# Patient Record
Sex: Female | Born: 1950
Health system: Southern US, Community
[De-identification: ages and names within clinical notes are randomized; demographics above are authoritative.]

## PROBLEM LIST (undated history)

## (undated) DIAGNOSIS — F32A Depression, unspecified: Secondary | ICD-10-CM

## (undated) DIAGNOSIS — F419 Anxiety disorder, unspecified: Secondary | ICD-10-CM

## (undated) DIAGNOSIS — E079 Disorder of thyroid, unspecified: Secondary | ICD-10-CM

## (undated) DIAGNOSIS — I10 Essential (primary) hypertension: Secondary | ICD-10-CM

## (undated) DIAGNOSIS — C801 Malignant (primary) neoplasm, unspecified: Secondary | ICD-10-CM

## (undated) DIAGNOSIS — F329 Major depressive disorder, single episode, unspecified: Secondary | ICD-10-CM

## (undated) DIAGNOSIS — E7800 Pure hypercholesterolemia, unspecified: Secondary | ICD-10-CM

## (undated) HISTORY — DX: Disorder of thyroid, unspecified: E07.9

## (undated) HISTORY — DX: Anxiety disorder, unspecified: F41.9

## (undated) HISTORY — DX: Depression, unspecified: F32.A

## (undated) HISTORY — DX: Major depressive disorder, single episode, unspecified: F32.9

## (undated) HISTORY — DX: Essential (primary) hypertension: I10

## (undated) HISTORY — DX: Pure hypercholesterolemia, unspecified: E78.00

## (undated) HISTORY — PX: TONSILLECTOMY: SUR1361

## (undated) HISTORY — DX: Malignant (primary) neoplasm, unspecified: C80.1

---

## 2005-02-21 HISTORY — PX: OTHER SURGICAL HISTORY: SHX169

## 2005-09-14 ENCOUNTER — Emergency Department (HOSPITAL_COMMUNITY): Admission: EM | Admit: 2005-09-14 | Discharge: 2005-09-14 | Payer: Self-pay | Admitting: Emergency Medicine

## 2006-04-02 ENCOUNTER — Emergency Department (HOSPITAL_COMMUNITY): Admission: EM | Admit: 2006-04-02 | Discharge: 2006-04-02 | Payer: Self-pay | Admitting: Emergency Medicine

## 2006-04-02 ENCOUNTER — Ambulatory Visit: Payer: Self-pay | Admitting: Orthopedic Surgery

## 2006-04-10 ENCOUNTER — Ambulatory Visit: Payer: Self-pay | Admitting: Orthopedic Surgery

## 2006-04-24 ENCOUNTER — Ambulatory Visit: Payer: Self-pay | Admitting: Orthopedic Surgery

## 2006-05-10 ENCOUNTER — Ambulatory Visit: Payer: Self-pay | Admitting: Orthopedic Surgery

## 2006-06-12 ENCOUNTER — Ambulatory Visit: Payer: Self-pay | Admitting: Orthopedic Surgery

## 2006-07-12 ENCOUNTER — Ambulatory Visit: Payer: Self-pay | Admitting: Orthopedic Surgery

## 2006-07-18 ENCOUNTER — Ambulatory Visit: Payer: Self-pay | Admitting: Orthopedic Surgery

## 2006-08-14 ENCOUNTER — Ambulatory Visit: Payer: Self-pay | Admitting: Orthopedic Surgery

## 2006-09-21 ENCOUNTER — Ambulatory Visit: Payer: Self-pay | Admitting: Orthopedic Surgery

## 2006-10-19 ENCOUNTER — Ambulatory Visit: Payer: Self-pay | Admitting: Orthopedic Surgery

## 2006-12-20 ENCOUNTER — Ambulatory Visit: Payer: Self-pay | Admitting: Orthopedic Surgery

## 2006-12-20 DIAGNOSIS — S42309A Unspecified fracture of shaft of humerus, unspecified arm, initial encounter for closed fracture: Secondary | ICD-10-CM

## 2007-04-11 ENCOUNTER — Ambulatory Visit: Payer: Self-pay | Admitting: Orthopedic Surgery

## 2007-04-24 ENCOUNTER — Inpatient Hospital Stay (HOSPITAL_COMMUNITY): Admission: RE | Admit: 2007-04-24 | Discharge: 2007-04-25 | Payer: Self-pay | Admitting: Orthopedic Surgery

## 2007-04-24 ENCOUNTER — Ambulatory Visit: Payer: Self-pay | Admitting: Orthopedic Surgery

## 2007-04-25 ENCOUNTER — Telehealth: Payer: Self-pay | Admitting: Orthopedic Surgery

## 2007-04-26 ENCOUNTER — Ambulatory Visit: Payer: Self-pay | Admitting: Orthopedic Surgery

## 2007-05-07 ENCOUNTER — Ambulatory Visit: Payer: Self-pay | Admitting: Orthopedic Surgery

## 2007-06-06 ENCOUNTER — Ambulatory Visit: Payer: Self-pay | Admitting: Orthopedic Surgery

## 2007-07-04 ENCOUNTER — Ambulatory Visit: Payer: Self-pay | Admitting: Orthopedic Surgery

## 2007-08-15 ENCOUNTER — Ambulatory Visit: Payer: Self-pay | Admitting: Orthopedic Surgery

## 2007-08-29 IMAGING — CR DG HUMERUS 2V *L*
2 series · 2 of 2 positions shown · non-contrast
Comparison: Pre-reduction films.

CLINICAL DATA: 55-year-old, fell off horse. 
 LEFT HUMERUS - 2 VIEW:

[view not recorded (1 of 2)]
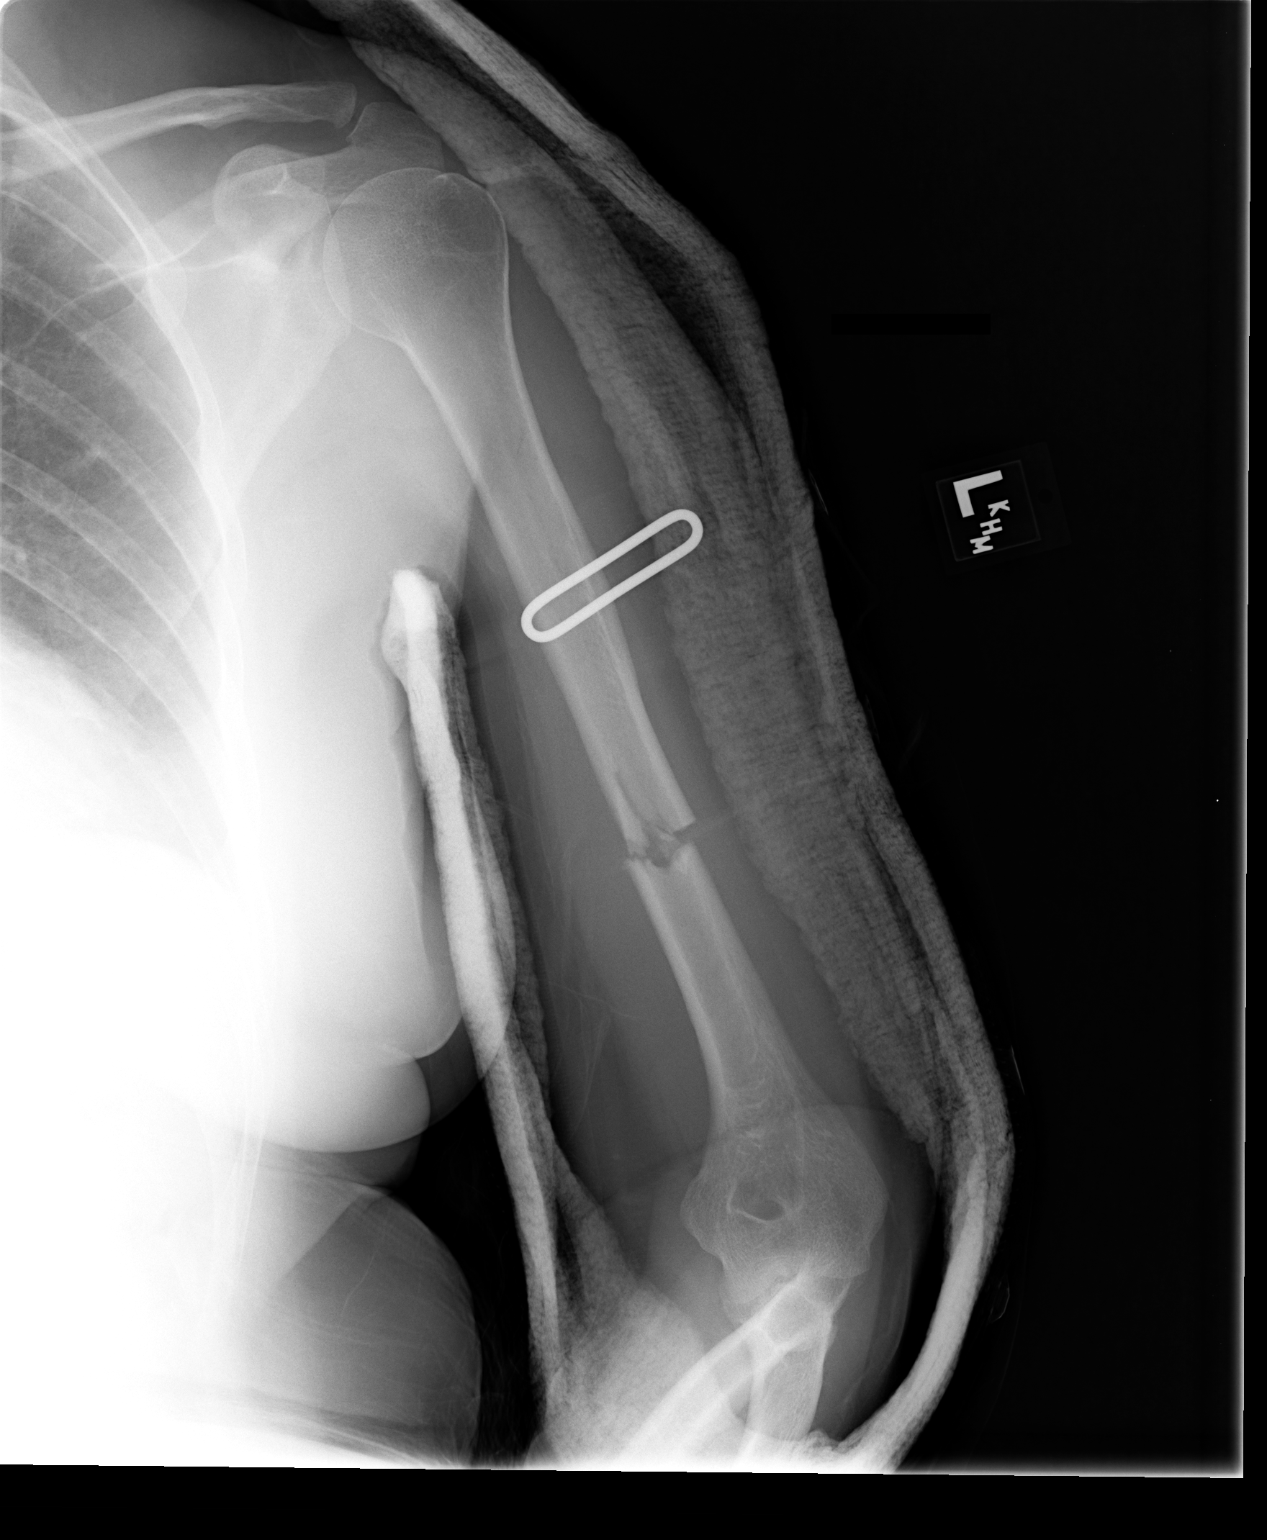

[view not recorded (2 of 2)]
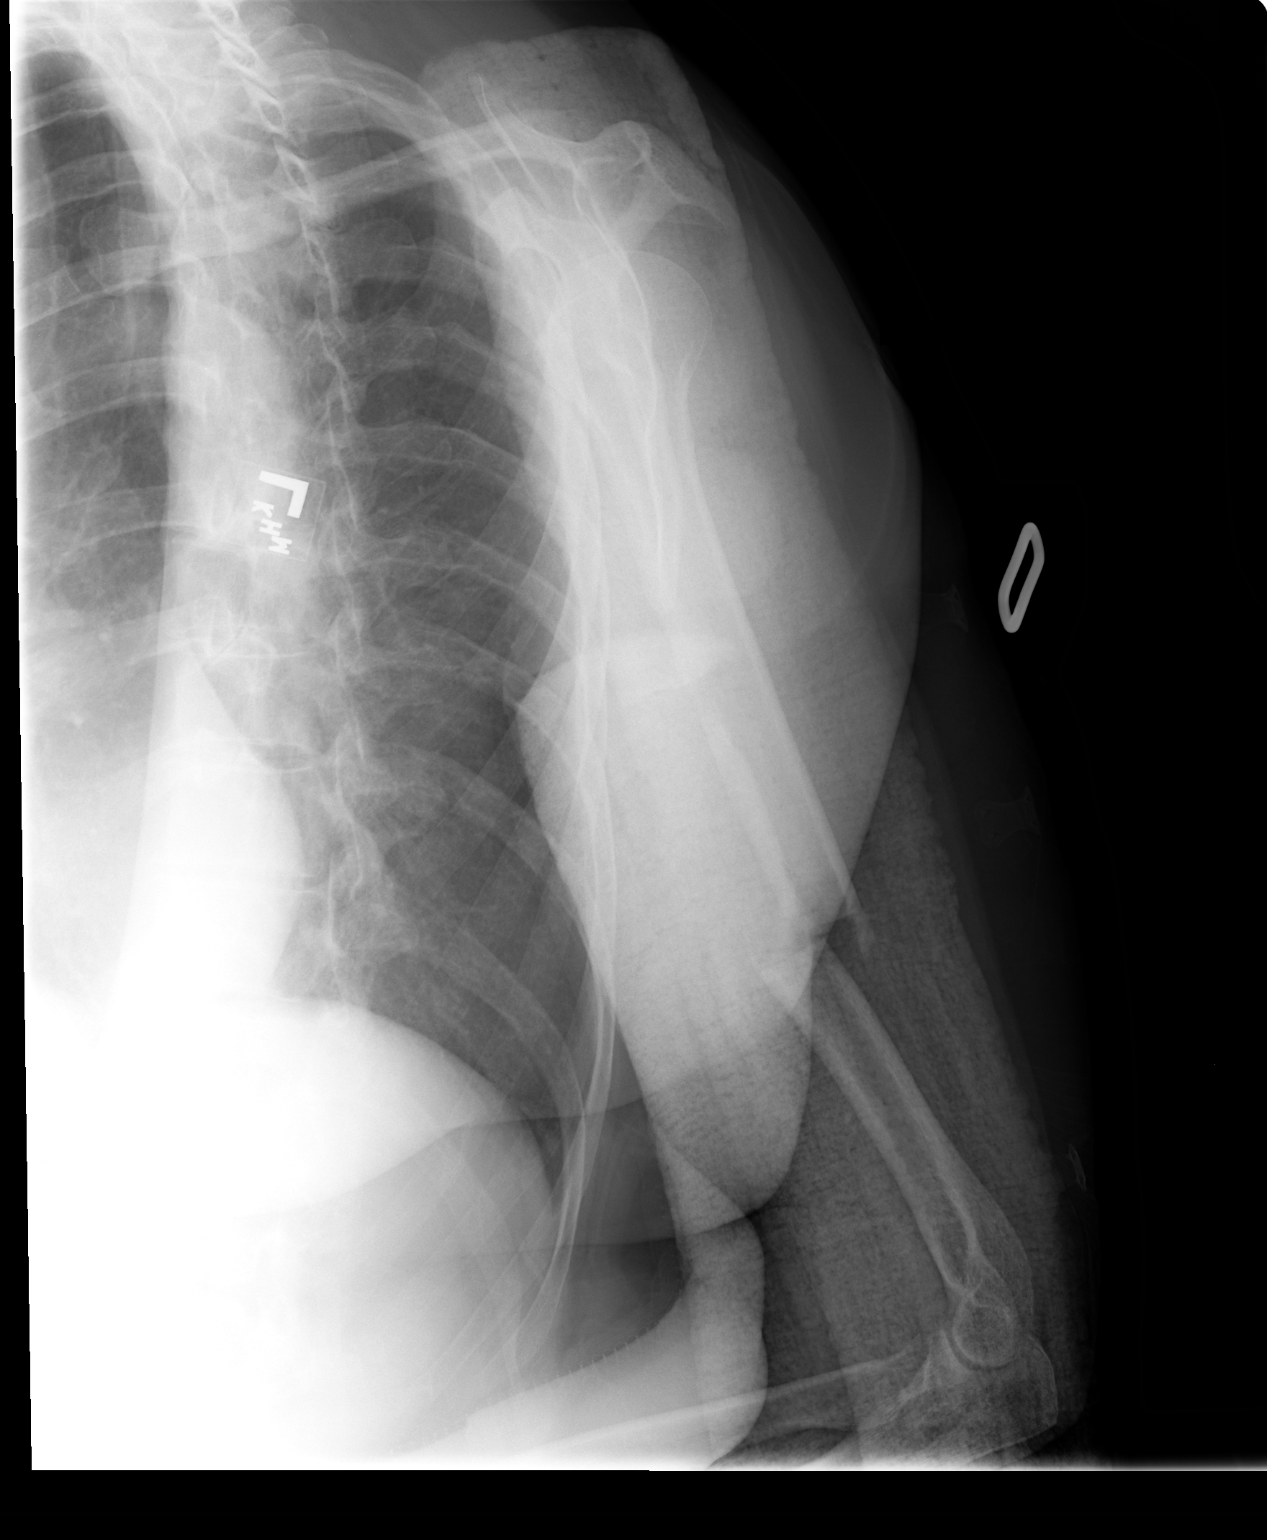

[2 of 2 positions shown; findings below may reference images not displayed]

FINDINGS: Post-reduction films show plaster splinting in place.  There has been interval partial reduction of the left humerus. There continues to be approximately one-half shaft width displacement of the distal fragment.
IMPRESSION: Partially reduced left humerus fracture.

## 2007-10-22 ENCOUNTER — Ambulatory Visit: Payer: Self-pay | Admitting: Orthopedic Surgery

## 2007-12-24 ENCOUNTER — Ambulatory Visit: Payer: Self-pay | Admitting: Orthopedic Surgery

## 2008-09-19 IMAGING — RF DG HUMERUS 2V *L*
1 series · 10 of 10 positions shown · non-contrast
Comparison: Left humeral radiographs 04/02/06.

CLINICAL DATA: Left humeral nonunion.  Post ORIF left humerus in OR.
 LEFT HUMERUS ? 2 VIEW:

[Series 1: run · 10 of 10 slices shown]
[im 1/10]
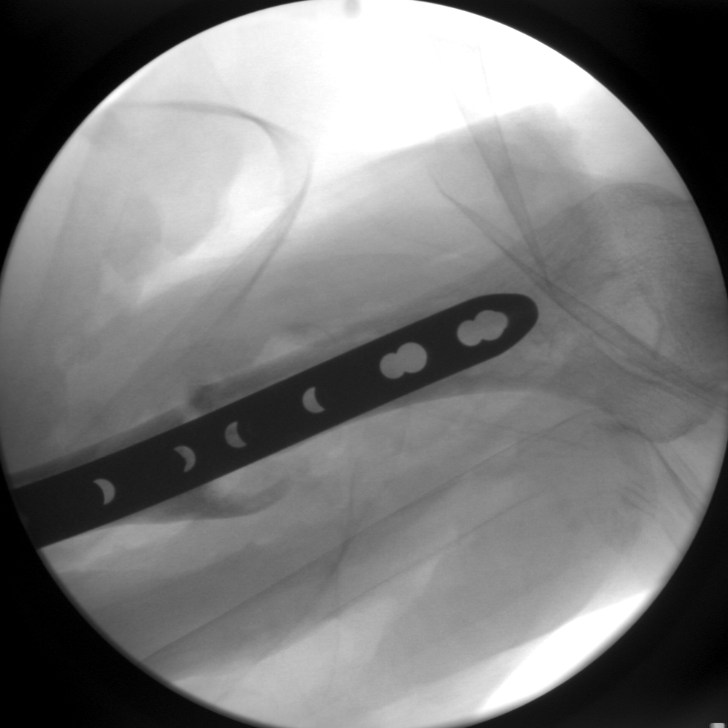
[im 2/10]
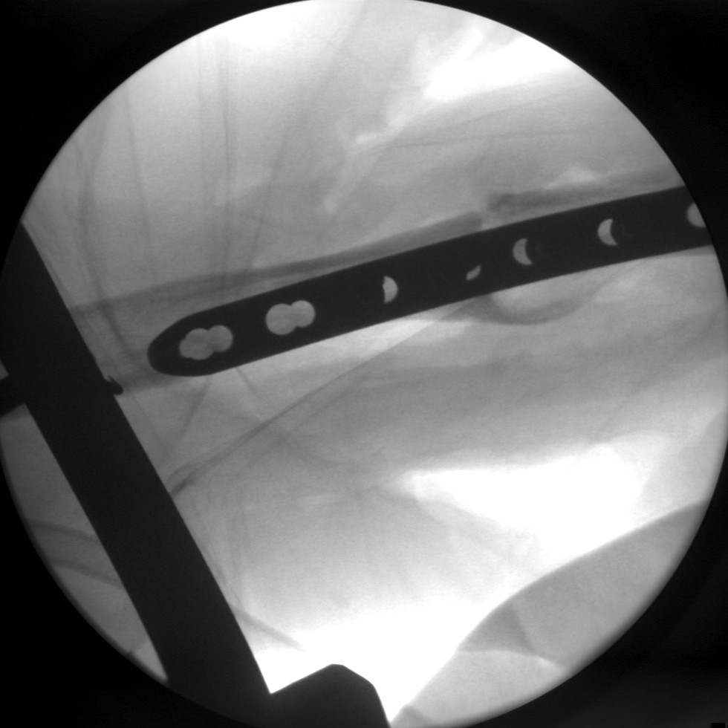
[im 3/10]
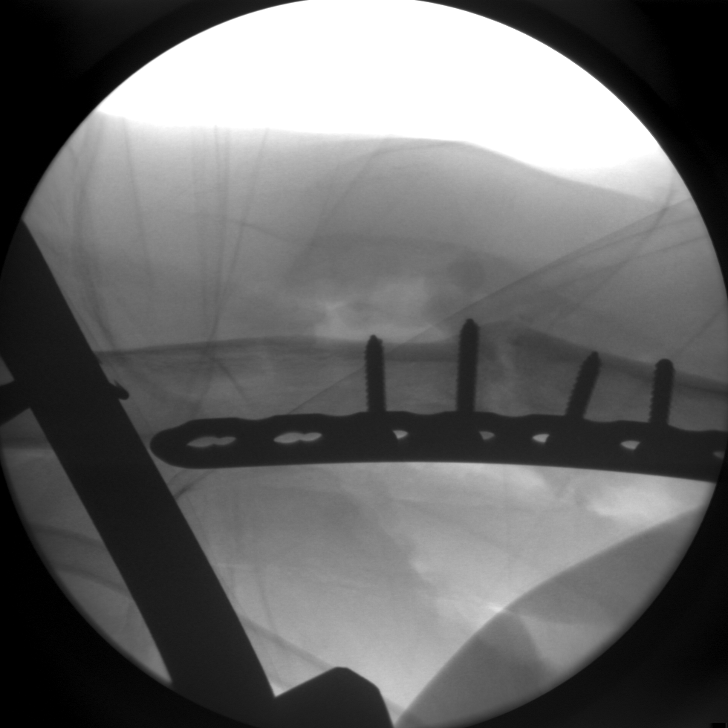
[im 4/10]
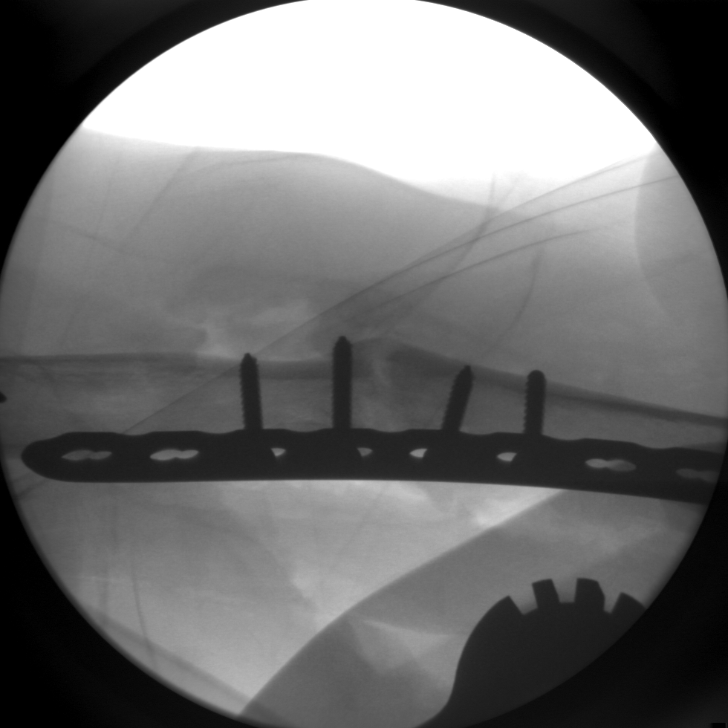
[im 5/10]
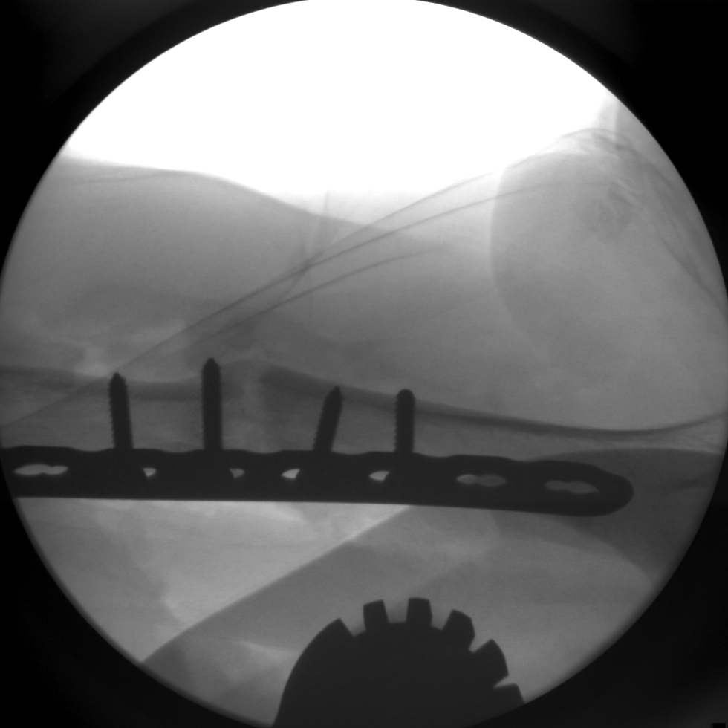
[im 6/10]
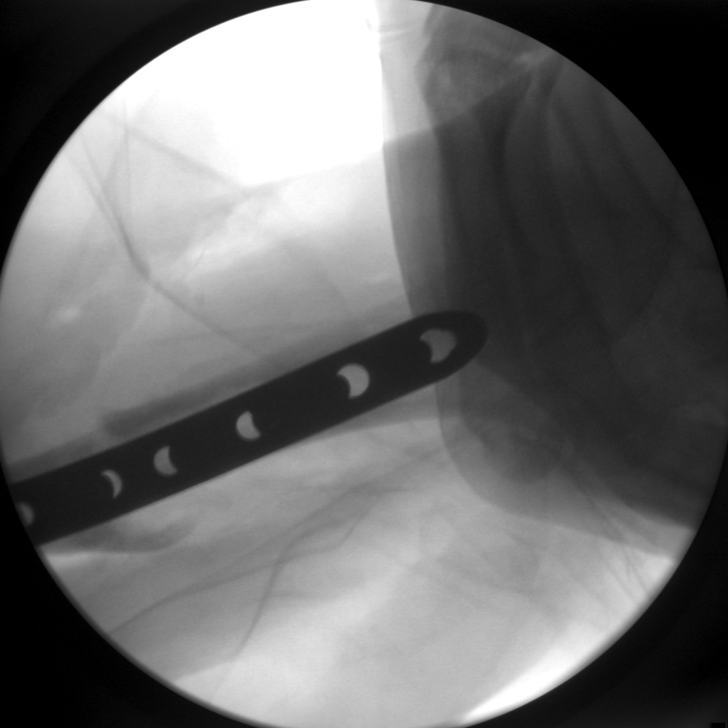
[im 7/10]
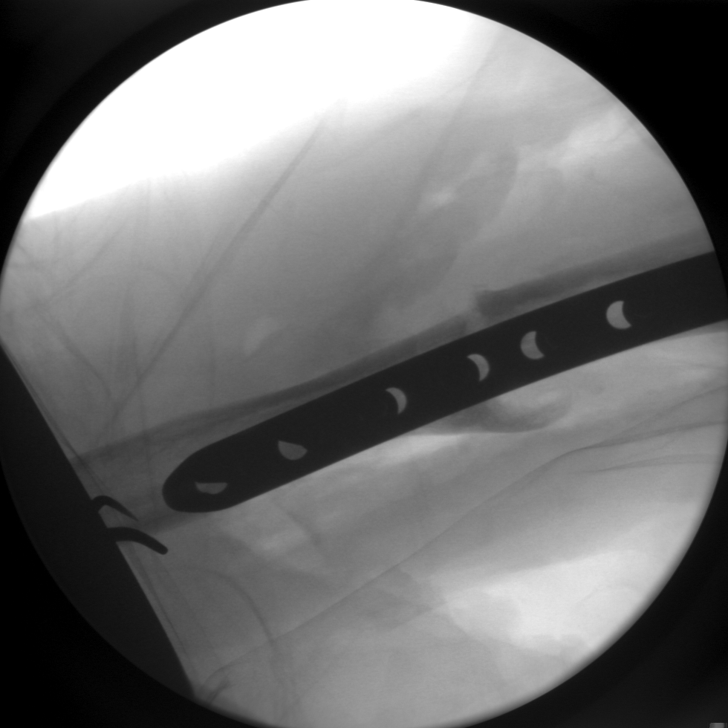
[im 8/10]
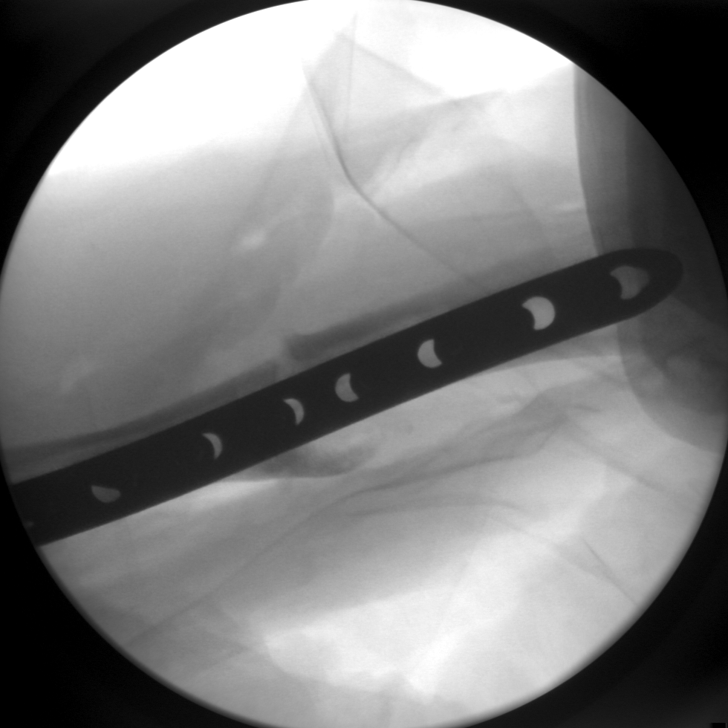
[im 9/10]
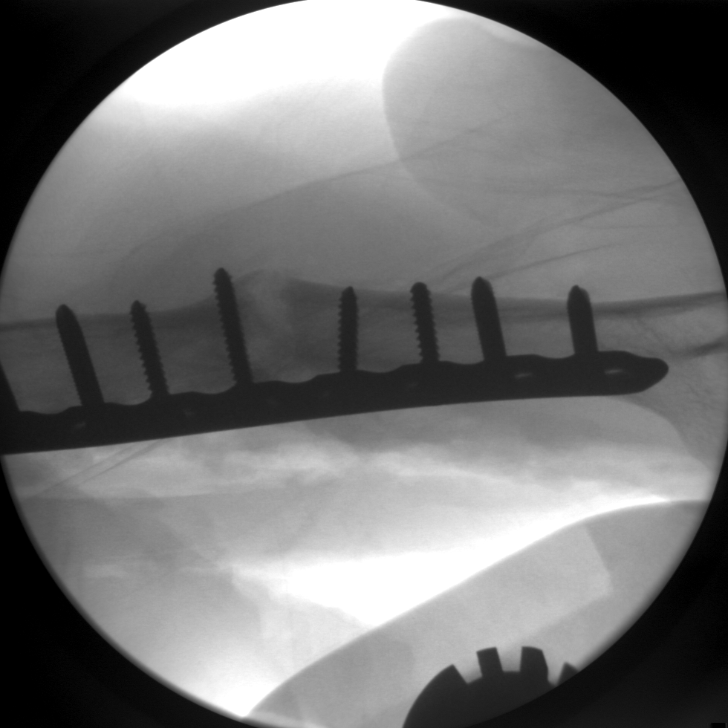
[im 10/10]
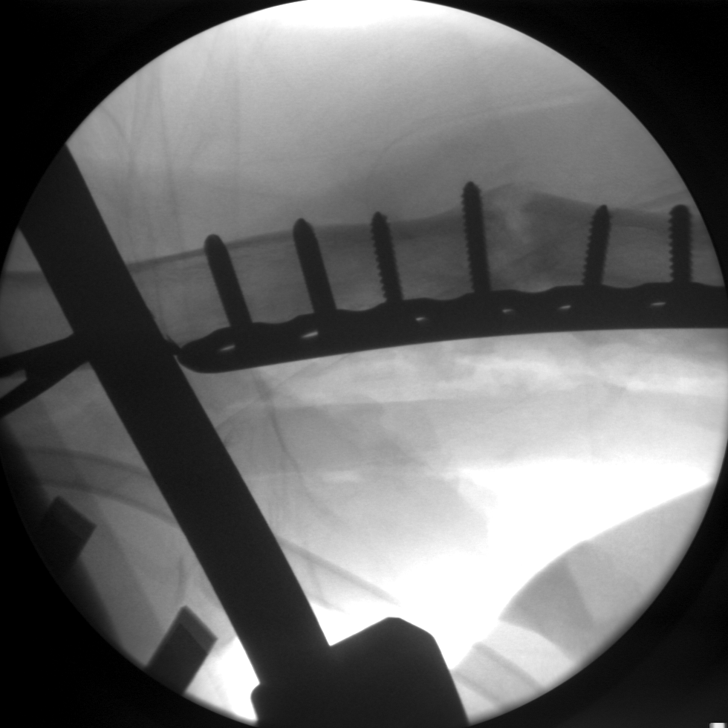

[10 of 10 positions shown; findings below may reference images not displayed]

FINDINGS: Ten spot fluoroscopic images are submitted postoperatively from the operating room. These demonstrate the placement of a plate across the humeral diaphyseal fracture, secured by multiple screws. The hardware appears well positioned. Previously demonstrated fracture line remains apparent, compatible with nonunion.  No acute fractures are seen.
IMPRESSION: Intraoperative views following left humeral ORIF.  The alignment is anatomic.

## 2010-02-21 HISTORY — PX: TONGUE SURGERY: SHX810

## 2010-07-06 NOTE — Discharge Summary (Signed)
NAMEGEORGEANNE, FRANKLAND                 ACCOUNT NO.:  [id number]   MEDICAL RECORD NO.:  [id number]          PATIENT TYPE:  INP   LOCATION:  A336                          FACILITY:  [facility]   PHYSICIAN:  Vickki Hearing, M.D.DATE OF BIRTH:  [birth year]   DATE OF ADMISSION:  04/24/2007  DATE OF DISCHARGE:  03/04/2009LH                               DISCHARGE SUMMARY   ADMISSION DIAGNOSIS:  Re-fracture left humeral shaft.   DISCHARGE DIAGNOSIS:  Re-fracture left humeral shaft.   PROCEDURE:  Open treatment, internal fixation, left humerus with Synthes  locking plate, Autograft, DBX bone putty 2.5 cc, Life Net bone croutons,  10 cc.   SURGEON:  Vickki Hearing, M.D.   ASSISTANT:  Wayne McFadder.   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   HOSPITAL COURSE:  The patient was admitted on April 24, 2007 and went to  surgery.  Had no complications, was admitted to evaluate the  postoperative condition of the radial nerve which was seen at surgery  and mobilized to complete the fixation.   The patient's nerve function was completely  normal.  On April 25, 2007  she was allowed to be discharged.  She is discharged to home on the  following medications:   DISCHARGE MEDICATIONS:  1. Ambien 10 mg orally at night.  2. Zoloft 50 mg orally daily.  3. Metoprolol 50 mg orally daily.  4. Levothyroxine 75 mcg orally daily.  5. Simvastatin 20 mg orally daily.  6. Hydrochlorothiazide 12.5 mg orally daily.  7. Xanax 0.25 mg q.6h. orally as needed.  8. Vicodin 5/500 mg one to two orally every 4 hours as needed for      pain.   CONDITION ON DISCHARGE:  She is discharged in a sling.  She is in stable  condition.   FOLLOWUP:  She should follow up with me on April 26, 2007 for dressing  change.      Vickki Hearing, M.D.  Electronically Signed     SEH/MEDQ  D:  04/25/2007  T:  04/25/2007  Job:  [id number]

## 2010-07-06 NOTE — Op Note (Signed)
NAMEJAMILLIA, Cassandra Oliver                 ACCOUNT NO.:  [id number]   MEDICAL RECORD NO.:  [id number]          PATIENT TYPE:  AMB   LOCATION:  DAY                           FACILITY:  [facility]   PHYSICIAN:  Vickki Hearing, M.D.DATE OF BIRTH:  02-16-1951   DATE OF PROCEDURE:  DATE OF DISCHARGE:                               OPERATIVE REPORT   HISTORY AND INDICATIONS FOR PROCEDURE:  Cassandra Oliver is 60 years old.  She  presented as a 60 year old female in February 2008 after falling off a  horse and fracturing her left humerus.  She was treated with closed  reduction and coaptation splinting, followed by fracture bracing.  She  did not heal.  She was placed in a bone stimulator and got a fibrous,  painless union with hypertrophic bone.  She did well for approximately 1  year.  She fell off a horse again on March 31, 2007, and disrupted the  hypertrophic fibrous union with significant angulation and pain, so she  presented for open treatment and internal fixation of the left humeral  shaft.   PREOPERATIVE DIAGNOSIS:  Refracture, left humeral shaft.   POSTOPERATIVE DIAGNOSIS:  Refracture, left humeral shaft.   PROCEDURE:  Open treatment and internal fixation, left humeral shaft  with bone graft and a Synthes eight-hole locking plate.   SURGEON:  Vickki Hearing, M.D., assisted by [facility].   General anesthesia.   FINDINGS:  There was a hypertrophic fibrous tissue at the fracture site  with hypertrophic callus.  Radial nerve was intact.   There were no specimens.  There was 100 mL blood loss.  There were no  complications.  Sponge and needle counts were correct.  The patient was  extubated and taken to recovery room in good condition.   DETAILS OF PROCEDURE:  The patient was identified as Therapist, art.  She  marked her left humerus as the surgical site and this was countersigned  by the surgeon.  She was given a preoperative dose of Ancef 1 g.  She  was taken to the operating room  for general anesthesia.  After  successful anesthesia she was placed in the prone position with  appropriate padding.  Her left arm was then prepped and draped using  sterile technique with a DuraPrep solution.  A time-out procedure was  completed and the procedure was confirmed as stated.   In the prone position a straight midline incision was made and the  subcutaneous tissue and fat were divided down to the triceps tendon.  The lateral and long heads were split with blunt dissection down to the  tendinous portion, which was sharply divided down to bone.  The deep  head for deep head was subperiosteally stripped from the bone after  proper identification and protection of the radial nerve.  The fibrous  tissue and hypertrophic bone was taken down and kept as bone graft.  The  fracture was reduced.  An eight-hole Synthes plate was applied to bone  with bone clamps.  The first screw placed distal to the fracture was a  neutral screw.  We  used a 3.2 drill bit and a 4.5 screw with appropriate  depth gauge.  It was a self-tapping screw.  We then placed a screw in  compression mode and got good compression at the fracture site.  Two  additional screws were then applied in a nonlocking mode.  Radiographs  were obtained.  AP and oblique radiographs showed fracture to be  reduced.  We placed an additional two locking screws proximally and  distally for a total of eight screws.  There were bicortical screws.  Repeat films showed excellent reduction and hardware position.  Radial  nerve was checked and was protected throughout the case.  The wounds  were then irrigated.  We used 2.5 mL of DBX bone putty.  We used  cancellous bone chips an autograft mixed with blood in a slurry and  paste and applied it around the fracture.  We then closed the wound by  reapproximating the tendon with 0 Monocryl and the subcu tissue with 0  and 2-0 Monocryl.  We injected 30 mL of Marcaine with epinephrine in the   subcu layer of the skin and we applied a sterile bandage from the hand  to the axilla with a sling.  The patient was placed back on her bed and  taken to the recovery room after extubation in stable condition.  Staples can come out in 10-12 days.  Range of motion exercises can be  started at that time.   ADDENDUM:  A total of 10 mL of bone chips were applied.      Vickki Hearing, M.D.  Electronically Signed     SEH/MEDQ  D:  04/24/2007  T:  04/24/2007  Job:  [id number]

## 2010-07-06 NOTE — H&P (Signed)
Cassandra Oliver, Cassandra Oliver                 ACCOUNT NO.:  [id number]   MEDICAL RECORD NO.:  [id number]          PATIENT TYPE:  AMB   LOCATION:  DAY                           FACILITY:  [facility]   PHYSICIAN:  Vickki Hearing, M.D.DATE OF BIRTH:  04/29/1950   DATE OF ADMISSION:  DATE OF DISCHARGE:  LH                              HISTORY & PHYSICAL   CHIEF COMPLAINT:  Non-union left humeral shaft.   HISTORY:  Is a 60 year old female who presented in February 2008 after  falling off a horse.  Date of injury was April 02, 2006.  She  presented with pain and deformity in the left upper extremity and an  intact radial nerve.  At the time, she had no other injuries.  She was  evaluated in the emergency room and treated with a coaptation splint  with adequate reduction, and was scheduled for a follow-up visit.   Cassandra Oliver was followed over time, maintained good alignment, but  fracture did not appear to heal.  She was eventually placed in a bone  stimulator.  The bone stimulator was placed approximately 3 months after  injury, although it was ordered approximately 6 weeks after injury but  was denied by her Cassandra Oliver.  Once we did put the bone stimulator on, the  patient did reasonably well but appeared to have a hypertrophic fibrous  union.  Surgery was recommended.  She sought second opinion and decided  to wait.  She regained almost all of her normal activities and did well  until March 31, 2007, at which time she was riding a horse again, this  time on a horse that was not hers.  She fell off the horse and noticed  that her arm felt different, that something was moving, although she had  discomfort.  She did not have severe pain.   She was seen on February 26, and repeat film showed a increased  angulation at the fracture.  There was gross motion at the fracture. The  radial nerve remained intact, and surgery was recommended.   I discussed the severe nature of this problem, the complex  nature of  this surgery.  We discussed the risk of bleeding, infection, radial  nerve and injury and vascular injury.  The diagnosis of hypertrophic  non0union was discussed.  The risk of radial nerve injury was discussed.  The reason for surgery to give fracture consolidation was discussed.  Specific risks to this procedure include radial nerve injury and  continued nonunion.  We discussed that she will get off Allograft bone.  She was told to wear a brace until the day of surgery.   ALLERGIES:  SHE HAS NO KNOWN DRUG ALLERGIES.   PAST MEDICAL HISTORY:  She has hypertension, thyroid disease, high  cholesterol, history of depression, hyperlipidemia.   FAMILY HISTORY:  Negative.   SOCIAL HISTORY:  Nonsmoker, nondrinker.  No drug abuse.  Lives with her  husband.   MEDICATIONS:  The most recent medication list update includes the  following medications:  1. Hydrochlorothiazide 12.5 mg.  2. Levothyroxine 75 mcg.  3. Zocor  20 mg.  4. Toprol 50 mg.  5. Zoloft 50 mg.  6. Ambien 10 mg.  7. Vicodin 5 mg.   PHYSICAL EXAMINATION:  VITAL SIGNS:  Stable.  GENERAL APPEARANCE:  The patient is well-developed and nourished,  grooming and hygiene are normal.  She might be a little bit overweight.  CARDIOVASCULAR:  Shows normal pulse and perfusion to the extremities.  Lymphatic system shows no enlargement.  NEUROLOGIC:  Other neurologic findings include intact radial nerve  function.  EXTREMITIES:  Extensors to the wrist, MP joint extensors are intact.  SKIN:  Normal dry, clean, no lesions.  MUSCULOSKELETAL:  Findings:  The arm is deformed.  There is motion at  the fracture site.  She has good grip.  She actually has intact elbow  flexion, extension, and abduction 90 degrees, flexion 90 degrees in the  shoulder.  Wrist, shoulder, and elbow joints are stable.   RADIOGRAPHS:  Again, show a nonunion with hypertrophic callus formation.  Diagnosis is non-union. left humerus.   PLAN:  Open  treatment, internal fixation, left humerus shaft.  We will  do this in the prone position.      Vickki Hearing, M.D.  Electronically Signed     SEH/MEDQ  D:  04/23/2007  T:  04/23/2007  Job:  [id number]   cc:   [facility]

## 2010-07-09 NOTE — Consult Note (Signed)
Cassandra Oliver, Cassandra Oliver                 ACCOUNT NO.:  [id number]   MEDICAL RECORD NO.:  [id number]          PATIENT TYPE:  EMS   LOCATION:  ED                            FACILITY:  [facility]   PHYSICIAN:  Vickki Hearing, M.D.DATE OF BIRTH:  08-15-1950   DATE OF CONSULTATION:  04/02/2006  DATE OF DISCHARGE:  04/02/2006                                 CONSULTATION   CHIEF COMPLAINT:  Pain in the left arm.   HISTORY OF PRESENT ILLNESS:  This is a 60 year old female who presented  after falling off a horse complaining of pain and deformity of the left  upper extremity.  She was neurovascularly intact, and according to Dr.  Neale Burly who evaluated her in the emergency room, she complained of  severe nonradiating pain over the midshaft of the left humerus with no  distal neurological dysfunction.  No other injuries noted or complained  of.   REVIEW OF SYSTEMS:  Review of systems x10 is recorded and incorporated  by reference as dictated on the ER documentation sheet.   PAST MEDICAL HISTORY:  1. Hypertension.  2. Depression.  3. Hyperlipidemia.  4. Hypothyroidism.   FAMILY HISTORY:  Negative and noncontributory.   SOCIAL HISTORY:  Nonsmoker, nondrinker.  No drug abuse.  Lives with  husband.   ALLERGIES:  NONE KNOWN.   MEDICATIONS:  1. Zoloft 10 mg daily  2. Toprol-XL 50 mg daily.  3. Simvastatin daily.  4. Synthroid 75 mcg daily.   PHYSICAL EXAMINATION:  VITAL SIGNS:  Stable.  GENERAL:  The patient was awake, alert, and oriented x3.  Mood and  affect was normal.  EXTREMITIES:  Distal neurological function of the wrist extensors and  sensory exam was normal.  Pulses were excellent.  She was tender, and  there was an apex anterior deformity of the left midshaft humerus.  We  could not assess range of motion of the elbow or shoulder.  Wrist motion  was fine, passive and active.  Elbow and shoulder joints were stable.  Muscle strength and muscle tone were deferred.  Skin was intact  over the  area in question.   Her lower extremities had gross normal malalignment.  There were no  contractures.  There was no dislocation.  There was excellent muscle  tone.   Right upper extremity had no malalignment, normal range of motion, no  instability or subluxation, no atrophy or tremor.  Normal strength and  muscle tone.   Radiographs were reviewed.  There was a midshaft humerus fracture.  This  was apex anterior angulation.  Plan was for closed manipulation,  coaptation, splint.      Vickki Hearing, M.D.  Electronically Signed     SEH/MEDQ  D:  04/10/2006  T:  04/10/2006  Job:  [id number]

## 2010-07-09 NOTE — Op Note (Signed)
Cassandra Oliver, Cassandra Oliver                 ACCOUNT NO.:  [id number]   MEDICAL RECORD NO.:  [id number]          PATIENT TYPE:  EMS   LOCATION:  ED                            FACILITY:  [facility]   PHYSICIAN:  Vickki Hearing, M.D.DATE OF BIRTH:  Aug 01, 1950   DATE OF PROCEDURE:  04/02/2006  DATE OF DISCHARGE:  04/02/2006                                PROCEDURE NOTE   PREPROCEDURE DIAGNOSIS:  Midshaft humerus fracture.   POSTPROCEDURE DIAGNOSIS:  Midshaft humerus fracture.   SURGEON:  Vickki Hearing, M.D.   PROCEDURE:  Closed manipulation, application of coaptation splint.   ANESTHESIA:  The procedure was done without anesthesia.   DESCRIPTION OF PROCEDURE:  The patient was placed at the edge of the  exam table.  The arm was allowed to hang in gravity position.  Coaptation splint was applied with valgus pressure.  The splint was  allowed to dry.   Repeat film was taken.  X-rays showed acceptable alignment on AP and  lateral views.   The postreduction radial nerve function was checked and was intact.   The patient was instructed to come back in a week on April 09, 2006  for repeat radiographs.   CODES:  [id number], [id number], and 812.21.      Vickki Hearing, M.D.  Electronically Signed     SEH/MEDQ  D:  04/10/2006  T:  04/10/2006  Job:  [id number]

## 2010-11-15 LAB — BASIC METABOLIC PANEL
Calcium: 9.9
Chloride: 100
Creatinine, Ser: 1.18
GFR calc non Af Amer: 48 — ABNORMAL LOW
Glucose, Bld: 76
Potassium: 4.4

## 2010-11-15 LAB — CBC
Hemoglobin: 15.4 — ABNORMAL HIGH
MCV: 89.6
Platelets: 383
RBC: 4.92

## 2010-11-22 DIAGNOSIS — C801 Malignant (primary) neoplasm, unspecified: Secondary | ICD-10-CM

## 2010-11-22 HISTORY — DX: Malignant (primary) neoplasm, unspecified: C80.1

## 2010-12-06 ENCOUNTER — Telehealth: Payer: Self-pay | Admitting: Orthopedic Surgery

## 2010-12-06 NOTE — Telephone Encounter (Signed)
Patient called to relay that she may be needing to have MRI's, PET scans, relating to upcoming appointment with Dr. Manson Passey, ENT specialist at [facility], 12/11/10.  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    York Spaniel she will be needing information about any metal in her arm from surgery 04/24/07 and said is unsure exactly what she may need in addition, until she has the appointment.  Advised of our medical records process and the need to sign a release. Asked if Dr. Romeo Apple has any information about the metal implant for now.  Her phone # is [phone].

## 2010-12-07 NOTE — Telephone Encounter (Signed)
Called back to patient and relayed. °

## 2010-12-07 NOTE — Telephone Encounter (Signed)
No need to worry about the metal in her arm

## 2016-07-08 ENCOUNTER — Encounter: Payer: Self-pay | Admitting: Nurse Practitioner

## 2016-07-08 ENCOUNTER — Ambulatory Visit (INDEPENDENT_AMBULATORY_CARE_PROVIDER_SITE_OTHER): Payer: 59 | Admitting: Nurse Practitioner

## 2016-07-08 VITALS — BP 132/82 | Ht 64.0 in | Wt 133.8 lb

## 2016-07-08 DIAGNOSIS — F418 Other specified anxiety disorders: Secondary | ICD-10-CM

## 2016-07-08 DIAGNOSIS — Z79891 Long term (current) use of opiate analgesic: Secondary | ICD-10-CM | POA: Diagnosis not present

## 2016-07-08 DIAGNOSIS — G4700 Insomnia, unspecified: Secondary | ICD-10-CM | POA: Diagnosis not present

## 2016-07-08 MED ORDER — ZOLPIDEM TARTRATE 10 MG PO TABS: 10.0000 mg | ORAL_TABLET | Freq: Every evening | ORAL | 2 refills | Status: DC | PRN

## 2016-07-08 MED ORDER — ALPRAZOLAM 1 MG PO TABS: 1.0000 mg | ORAL_TABLET | Freq: Two times a day (BID) | ORAL | 2 refills | Status: DC | PRN

## 2016-07-08 MED ORDER — HYDROCODONE-ACETAMINOPHEN 5-325 MG PO TABS: ORAL_TABLET | ORAL | 0 refills | Status: DC

## 2016-07-10 ENCOUNTER — Encounter: Payer: Self-pay | Admitting: Nurse Practitioner

## 2016-07-10 DIAGNOSIS — G4700 Insomnia, unspecified: Secondary | ICD-10-CM | POA: Insufficient documentation

## 2016-07-10 DIAGNOSIS — Z79891 Long term (current) use of opiate analgesic: Secondary | ICD-10-CM | POA: Insufficient documentation

## 2016-07-10 DIAGNOSIS — F418 Other specified anxiety disorders: Secondary | ICD-10-CM | POA: Insufficient documentation

## 2016-07-10 NOTE — Progress Notes (Signed)
Subjective:  Presents to establish herself as a new patient in our practice. Has been on her meds long term per Dr. Manuella Ghazi in [place]. Does not take her meds together to avoid excessive drowsiness. Takes pain med prn for chronic pain around the mouth and neck area from surgery and radiation for cancer. Previous records are not available during visit. Struggling with depression and anxiety related to residual effects from her husband's stroke. Finds herself crying at times. Denies suicidal or homicidal thoughts or ideation.   Objective:   BP 132/82   Ht 5\' 4"  (1.626 m)   Wt 133 lb 12.8 oz (60.7 kg)   BMI 22.97 kg/m  NAD. Alert, oriented. Thoughts logical, coherent and relevant. Crying at times during office visit. Making good eye contact. Lungs clear. Heart RRR.   Assessment:   Problem List Items Addressed This Visit      Other   Anxiety with depression   Encounter for long-term opiate analgesic use - Primary   Insomnia       Plan:   Meds ordered this encounter  Medications  . sertraline (ZOLOFT) 50 MG tablet    Sig: Take 50 mg by mouth daily.  Marland Kitchen DISCONTD: HYDROcodone-acetaminophen (NORCO/VICODIN) 5-325 MG tablet    Sig: Take 1 tablet by mouth. 1-3 times a day  . DISCONTD: zolpidem (AMBIEN) 10 MG tablet    Sig: Take 10 mg by mouth at bedtime as needed for sleep.  . simvastatin (ZOCOR) 20 MG tablet    Sig: Take 20 mg by mouth daily.  Marland Kitchen DISCONTD: ALPRAZolam (XANAX) 1 MG tablet    Sig: Take 1 mg by mouth 2 (two) times daily as needed for anxiety.  . metoprolol succinate (TOPROL-XL) 50 MG 24 hr tablet    Sig: Take 50 mg by mouth daily. Take with or immediately following a meal.  . levothyroxine (SYNTHROID, LEVOTHROID) 125 MCG tablet    Sig: Take 125 mcg by mouth daily before breakfast.  . zolpidem (AMBIEN) 10 MG tablet    Sig: Take 1 tablet (10 mg total) by mouth at bedtime as needed for sleep.    Dispense:  30 tablet    Refill:  2    Order Specific Question:   Supervising Provider    Answer:   Mikey Kirschner [[id number]]  . DISCONTD: HYDROcodone-acetaminophen (NORCO/VICODIN) 5-325 MG tablet    Sig: Take one tab 3 times a day prn pain    Dispense:  90 tablet    Refill:  0    Order Specific Question:   Supervising Provider    Answer:   Mikey Kirschner [[id number]]  . ALPRAZolam (XANAX) 1 MG tablet    Sig: Take 1 tablet (1 mg total) by mouth 2 (two) times daily as needed for anxiety.    Dispense:  60 tablet    Refill:  2    Order Specific Question:   Supervising Provider    Answer:   Mikey Kirschner [[id number]]  . DISCONTD: HYDROcodone-acetaminophen (NORCO/VICODIN) 5-325 MG tablet    Sig: Take one tab 3 times a day prn pain    Dispense:  90 tablet    Refill:  0    May fill 30 days from 07/08/16    Order Specific Question:   Supervising Provider    Answer:   Mikey Kirschner [[id number]]  . HYDROcodone-acetaminophen (NORCO/VICODIN) 5-325 MG tablet    Sig: Take one tab 3 times a day prn pain    Dispense:  90 tablet  Refill:  0    May fill 60 days from 07/08/16    Order Specific Question:   Supervising Provider    Answer:   Mikey Kirschner [[id number]]   [place] reviewed. ROR for old records. Patient states she had recent urine drug screen. Plan controlled substances contract at next visit if we will be handling pain management.  Return in about 3 months (around 10/08/2016) for pain management. Over 50% of the visit was spent in consultation and discussion.

## 2016-07-11 ENCOUNTER — Other Ambulatory Visit: Payer: Self-pay | Admitting: Nurse Practitioner

## 2016-07-11 ENCOUNTER — Encounter: Payer: Self-pay | Admitting: Family Medicine

## 2016-07-11 DIAGNOSIS — F418 Other specified anxiety disorders: Secondary | ICD-10-CM

## 2016-07-12 ENCOUNTER — Other Ambulatory Visit: Payer: Self-pay | Admitting: *Deleted

## 2016-07-12 ENCOUNTER — Telehealth: Payer: Self-pay | Admitting: Nurse Practitioner

## 2016-07-12 MED ORDER — METOPROLOL SUCCINATE ER 50 MG PO TB24: 50.0000 mg | ORAL_TABLET | Freq: Every day | ORAL | 1 refills | Status: DC

## 2016-07-12 NOTE — Telephone Encounter (Signed)
Refill sent to pharm. Pt notified  

## 2016-07-12 NOTE — Telephone Encounter (Signed)
Seen as a new pt on 5/18. Takes metoprolol 50mg  daily. Needed refill. Has not been prescribed this by our office. Pt states carolyn was going to refill this med. Would like 90 day supply

## 2016-07-12 NOTE — Telephone Encounter (Signed)
Ok plus one ref 

## 2016-07-12 NOTE — Telephone Encounter (Signed)
Patient was seen on 07/08/16 by Hoyle Sauer.  She said she was supposed to have Rx for Metoprolol called in, but pharmacy doesn't have anything.  Please advise.  [facility]

## 2016-07-25 ENCOUNTER — Ambulatory Visit (HOSPITAL_COMMUNITY): Payer: Self-pay | Admitting: Psychiatry

## 2016-08-03 ENCOUNTER — Encounter (HOSPITAL_COMMUNITY): Payer: Self-pay | Admitting: Psychiatry

## 2016-08-03 ENCOUNTER — Ambulatory Visit (INDEPENDENT_AMBULATORY_CARE_PROVIDER_SITE_OTHER): Payer: Medicare Other | Admitting: Psychiatry

## 2016-08-03 DIAGNOSIS — F331 Major depressive disorder, recurrent, moderate: Secondary | ICD-10-CM | POA: Diagnosis not present
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
## 2016-08-19 ENCOUNTER — Ambulatory Visit (INDEPENDENT_AMBULATORY_CARE_PROVIDER_SITE_OTHER): Payer: Medicare Other | Admitting: Psychiatry

## 2016-08-19 ENCOUNTER — Encounter (HOSPITAL_COMMUNITY): Payer: Self-pay | Admitting: Psychiatry

## 2016-08-19 DIAGNOSIS — F331 Major depressive disorder, recurrent, moderate: Secondary | ICD-10-CM

## 2016-08-19 NOTE — Progress Notes (Signed)
Patient:  Cassandra Oliver   DOB: 17-Apr-1950  MR Number: [id number]  Location: [facility]:  [address] [place],  [place], [address]  Start: Friday 08/19/2016 10:16 AM  End: Friday 08/19/2016 11:12 AM  Provider/Observer:     Cassandra Oliver, MSW, LCSW   Chief Complaint:      Chief Complaint  Patient presents with  . Depression   Reason For Service:     Cassandra Oliver is a 66 y.o. female who Is referred for services by PA a curling Hoskins due to patient suffering from symptoms of anxiety and depression. Patient reports she went into a deep depression at the 3 year mark of recovery from tongue cancer. She states no one her life would not be the same again. Her husband had a stroke in April 2018. He has recovered some of his abilities but he continues to have problems with short-term memory and attention he can drive anymore do his own grocery shopping. This is difficult for patient due to her food restrictions as a result of having cancer. Her husband also has heart issues and she is fearful of husband having another stroke. She states being scared every day of losing him.  Interventions Strategy:  supportive  Participation Level:   Active  Participation Quality:  Appropriate      Behavioral Observation:  Casual, Alert, and Tearful.   Current Psychosocial Factors: Husband's health, patient's health, discord with one of her sons  Content of Session:   Established rapport, reviewed symptoms, discussed stressors, facilitated patient's expression of thoughts and feelings, discussed patient's support system, provide psychoeducation regarding effects of stress and anxiety on the body, discussed rationale for and practices controlled deep breathing, assigned patient to practice 5 minutes 2 times per day.  Current Status:   crying all the time, feelings of hopelessness/helplessness in the morning, anxiety, shaking, become nauseated, isolation, poor motivation, diminished interest in  activities   Suicidal/Homicidal:    No  Patient Progress:   Fair. Patient reports little to no change in symptoms since assessment session. She reports increased to worry regarding her husband's health. He has heart issues and 3 blockages. She reports there is nothing that can be done about these issues. He also continues to have short-term memory difficulty and confusion as a result of a recent stroke. Patient reports she had been hopeful that his condition would improve. However, she now worries his condition will remain the same or worsen. She reports difficulty accepting possibility husband may not resume his previous functioning. She reports additional stress related to her oldest son, who resides in [place], deciding not to make his yearly visit here this year. She reports he seems to be blaming her for his father's recent stroke. However, she fears he is using this to avoid except in his father's condition. She worries how her son will be affected if something should happen to her husband. She also worries something may happen to her and there will be no one available to take care of her husband. She reports she is beginning to fear going places as she fears something will happen to her.  Target Goals:   1. Establish rapport.    2. Learn and implement calming skills.  Last Reviewed:     Goals Addressed Today:    1.2  Plan:      Return again in 2 weeks.  Impression/Diagnosis:   Patient is referred for services by PA Cassandra Oliver due to pateint suffering from symptoms of  anxiety and depression. She first began experiencing symptoms when she was diagnosed with tongue cancer in 2012. She began taking zoloft for depression after husband had heart surgery when patient was 66 years old. She began taking xanax  for anxiety in 2012. She reports going into a deep depression at the three-year recovery mark from tongue cancer. Symptoms worsened in  April 2018 when her husband suffered a stroke.  Current symptoms include crying spells, feelings of hopelessness/helplessness, anxiety, isolation, poor motivation, diminished interest in activities, and excessive worry.    Diagnosis:  Axis I: Moderate episode of recurrent major depressive disorder (Cassandra Oliver)          Axis II: Deferred    Cassandra Suell, LCSW 08/19/2016

## 2016-08-31 ENCOUNTER — Encounter (HOSPITAL_COMMUNITY): Payer: Self-pay | Admitting: Psychiatry

## 2016-08-31 ENCOUNTER — Ambulatory Visit (INDEPENDENT_AMBULATORY_CARE_PROVIDER_SITE_OTHER): Payer: Medicare Other | Admitting: Psychiatry

## 2016-08-31 DIAGNOSIS — F331 Major depressive disorder, recurrent, moderate: Secondary | ICD-10-CM

## 2016-08-31 NOTE — Progress Notes (Signed)
Patient:  Cassandra Oliver   DOB: 1950-03-25  MR Number: [id number]  Location: [facility]:  [address] [place],  [place], [address]  Start: Wednesday 08/31/2016 1:15 PM End: Wednesday 08/31/2016  2:10 PM  Provider/Observer:     Cassandra Oliver, MSW, LCSW   Chief Complaint:      No chief complaint on file.  Reason For Service:     Cassandra Oliver is a 66 y.o. female who Is referred for services by PA a curling Cassandra Oliver due to patient suffering from symptoms of anxiety and depression. Patient reports she went into a deep depression at the 3 year mark of recovery from tongue cancer. She states no one her life would not be the same again. Her husband had a stroke in April 2018. He has recovered some of his abilities but he continues to have problems with short-term memory and attention he can drive anymore do his own grocery shopping. This is difficult for patient due to her food restrictions as a result of having cancer. Her husband also has heart issues and she is fearful of husband having another stroke. She states being scared every day of losing him.  Interventions Strategy:  supportive  Participation Level:   Active  Participation Quality:  Appropriate      Behavioral Observation:  Casual, Alert, and Tearful.   Current Psychosocial Factors: Husband's health, patient's health, discord with one of her sons  Content of Session:    reviewed symptoms, praised and reinforced patient's efforts to practice controlled breathing, discussed effects, assigned patient to continue practicing controlled breathing daily, discuss patient's strengths and supports, developed treatment plan,    Current Status:   crying all the time, feelings of hopelessness/helplessness in the morning, anxiety, shaking, become nauseated, isolation, poor motivation, diminished interest in activities   Suicidal/Homicidal:    No  Patient Progress:   Cassandra Oliver. Patient reports little to no change in symptoms since assessment  session. She expresses less worry about her son as they have had increased positive interaction. She and her husband are planning to visit son in [place] for 2 weeks the beginning of August. Patient continues to experience sadness and worry about her husband's condition but has decided she wants to try to participate in more activities with husband. She worries about the future and is having difficulty being in the moment. She reports constantly thinking about husband not being here. She fears how she will respond when he dies. Patient reports little to no involvement in activity for self and states being fearful of leaving her husband to do things for herself. She also reports feeling guilty about leaving.  Target Goals:   1. Implement changes in time and effort allocation to restore balance to life.    2. Implement mindfulness techniques to cope with stress, anxiety and depression.  Last Reviewed:   08/31/2016  Goals Addressed Today:    1.2  Plan:      Return again in 2 weeks.  Impression/Diagnosis:   Patient is referred for services by PA Cassandra Oliver due to pateint suffering from symptoms of anxiety and depression. She first began experiencing symptoms when she was diagnosed with tongue cancer in 2012. She began taking zoloft for depression after husband had heart surgery when patient was 66 years old. She began taking xanax  for anxiety in 2012. She reports going into a deep depression at the three-year recovery mark from tongue cancer. Symptoms worsened in  April 2018 when her husband suffered a  stroke. Current symptoms include crying spells, feelings of hopelessness/helplessness, anxiety, isolation, poor motivation, diminished interest in activities, and excessive worry.    Diagnosis:  Axis I: Major depressive disorder, recurrent, moderate          Axis II: Deferred    Cassandra Castell, LCSW 08/31/2016

## 2016-09-13 ENCOUNTER — Ambulatory Visit (INDEPENDENT_AMBULATORY_CARE_PROVIDER_SITE_OTHER): Payer: Medicare Other | Admitting: Psychiatry

## 2016-09-13 ENCOUNTER — Encounter (HOSPITAL_COMMUNITY): Payer: Self-pay | Admitting: Psychiatry

## 2016-09-13 DIAGNOSIS — F331 Major depressive disorder, recurrent, moderate: Secondary | ICD-10-CM

## 2016-09-13 NOTE — Progress Notes (Signed)
Patient:  Cassandra Oliver   DOB: 12/03/1950  MR Number: [id number]  Location: [facility]:  [address] [place],  [place], [address]  Start: Tuesday 09/13/2016 3:15 PM End: Tuesday 09/13/2016 4:05 PM     Provider/Observer:     Maurice Small, MSW, LCSW   Chief Complaint:      Chief Complaint  Patient presents with  . Depression   Reason For Service:     Cassandra Oliver is a 66 y.o. female who Is referred for services by PA a curling Hoskins due to patient suffering from symptoms of anxiety and depression. Patient reports she went into a deep depression at the 3 year mark of recovery from tongue cancer. She states no one her life would not be the same again. Her husband had a stroke in April 2018. He has recovered some of his abilities but he continues to have problems with short-term memory and attention he can drive anymore do his own grocery shopping. This is difficult for patient due to her food restrictions as a result of having cancer. Her husband also has heart issues and she is fearful of husband having another stroke. She states being scared every day of losing him.  Interventions Strategy:  supportive  Participation Level:   Active  Participation Quality:  Appropriate      Behavioral Observation:  Casual, Alert, and Tearful.   Current Psychosocial Factors: Husband's health, patient's health, discord with one of her sons  Content of Session:    reviewed symptoms, assisted patient identify triggers of increased anxiety and worry, discussed the way patient's life has changed since her husban's stroke earlier this year,  facilitated expression of thoughts and feelings, praised and reinforced patient's efforts to practice controlled breathing, discussed effects, provided psychoeducation regarding anxiety and the fight/flight response and ways to trigger relaxation response, assisted patient identify the way she experiences anxiety, reviewed the rationale for practicing controlled  breathing, assigned patient to  practice controlled breathing daily,    Current Status:   crying all the time, feelings of hopelessness/helplessness in the morning, anxiety, shaking, become nauseated, isolation, poor motivation, diminished interest in activities   Suicidal/Homicidal:    No  Patient Progress:   [place]. Patient reports increased stress and anxiety since last session. This appears to be triggered by patient's and husband's upcoming trip to [place] in 2 weeks. They will drive through [place] which is where patient's husband had the stroke earlier this year. Patient reports increased fear of something happening to her husband. She is experiencing muscle tension and panic attacks.  Target Goals:   1. Implement changes in time and effort allocation to restore balance to life.    2. Implement mindfulness techniques to cope with stress, anxiety and depression.  Last Reviewed:   08/31/2016  Goals Addressed Today:    1.2  Plan:      Return again in 2 weeks.  Impression/Diagnosis:   Patient is referred for services by PA Pearson Forster due to pateint suffering from symptoms of anxiety and depression. She first began experiencing symptoms when she was diagnosed with tongue cancer in 2012. She began taking zoloft for depression after husband had heart surgery when patient was 66 years old. She began taking xanax  for anxiety in 2012. She reports going into a deep depression at the three-year recovery mark from tongue cancer. Symptoms worsened in  April 2018 when her husband suffered a stroke. Current symptoms include crying spells, feelings of hopelessness/helplessness, anxiety, isolation,  poor motivation, diminished interest in activities, and excessive worry.    Diagnosis:  Axis I: Major depressive disorder, recurrent, moderate          Axis II: Deferred    Eunice Winecoff, LCSW 09/13/2016

## 2016-10-07 ENCOUNTER — Encounter: Payer: Self-pay | Admitting: Nurse Practitioner

## 2016-10-07 ENCOUNTER — Ambulatory Visit (INDEPENDENT_AMBULATORY_CARE_PROVIDER_SITE_OTHER): Payer: Medicare Other | Admitting: Nurse Practitioner

## 2016-10-07 VITALS — BP 118/78 | Ht 64.0 in | Wt 132.8 lb

## 2016-10-07 DIAGNOSIS — G4700 Insomnia, unspecified: Secondary | ICD-10-CM

## 2016-10-07 DIAGNOSIS — Z79891 Long term (current) use of opiate analgesic: Secondary | ICD-10-CM

## 2016-10-07 DIAGNOSIS — F418 Other specified anxiety disorders: Secondary | ICD-10-CM | POA: Diagnosis not present

## 2016-10-10 ENCOUNTER — Ambulatory Visit: Payer: 59 | Admitting: Nurse Practitioner

## 2016-10-10 ENCOUNTER — Encounter: Payer: Self-pay | Admitting: Nurse Practitioner

## 2016-10-10 NOTE — Progress Notes (Signed)
Subjective:  Presents for pain management and to discuss her depression and anxiety. Has started seeing Maurice Small for counseling which is going well. Just went to [place] with her husband to see her family. Has been doing great for the past 2 months. Does not want to increase Zoloft dose at this time. Has cut back on her hydrocodone and Xanax use. Has chronic insomnia but no change. Records were received and reviewed from previous provider. Patient had urine drug testing in February 2018.   Objective:   BP 118/78   Ht 5\' 4"  (1.626 m)   Wt 132 lb 12.8 oz (60.2 kg)   BMI 22.80 kg/m  NAD. Alert, oriented. Cheerful, calm affect. Lungs clear. Heart RRR.   Assessment:   Problem List Items Addressed This Visit      Other   Anxiety with depression   Encounter for long-term opiate analgesic use - Primary   Insomnia       Plan:  Continue current meds and counseling.  Return in about 3 months (around 01/07/2017) for pain management.

## 2016-10-12 ENCOUNTER — Ambulatory Visit (INDEPENDENT_AMBULATORY_CARE_PROVIDER_SITE_OTHER): Payer: Self-pay | Admitting: Psychiatry

## 2016-10-12 ENCOUNTER — Encounter (HOSPITAL_COMMUNITY): Payer: Self-pay | Admitting: Psychiatry

## 2016-10-12 DIAGNOSIS — F331 Major depressive disorder, recurrent, moderate: Secondary | ICD-10-CM | POA: Diagnosis not present

## 2016-10-12 NOTE — Progress Notes (Signed)
Patient:  Cassandra Oliver   DOB: 07/11/1950  MR Number: [id number]  Location: [facility]:  [address] [place],  [place], [address]  Start: Wednesday 10/12/2016 1:12 PM End: Wednesday 10/12/2016  2:02 PM                 Provider/Observer:     Maurice Small, MSW, LCSW   Chief Complaint:      Chief Complaint  Patient presents with  . Depression   Reason For Service:     Cassandra Oliver is a 66 y.o. female who Is referred for services by PA a curling Hoskins due to patient suffering from symptoms of anxiety and depression. Patient reports she went into a deep depression at the 3 year mark of recovery from tongue cancer. She states no one her life would not be the same again. Her husband had a stroke in April 2018. He has recovered some of his abilities but he continues to have problems with short-term memory and attention he can drive anymore do his own grocery shopping. This is difficult for patient due to her food restrictions as a result of having cancer. Her husband also has heart issues and she is fearful of husband having another stroke. She states being scared every day of losing him.  Interventions Strategy:  supportive  Participation Level:   Active  Participation Quality:  Appropriate      Behavioral Observation:  Casual, Alert, and talkative, pleasant   Current Psychosocial Factors: Husband's health, patient's health, discord with one of her sons  Content of Session:    reviewed symptoms, praised and reinforced increased activity/ social involvement and regular practice of controlled breathing, discussed effects on mood and behavior, discussed the connection between thoughts/mood/behavior,assisted patient identify ways to maintain consistent activity and social involvement, assisted patient identify coping statement, assigned patient to review daily  Current Status:   Improved mood, decreased crying spells, decreased anxiety, decreased worry increased motivation, increased  interest in activities  Suicidal/Homicidal:    No  Patient Progress:   Good. Patient reports improved mood and decreased anxiety since last session. She reports she and her husband really enjoyed their visit to [place] to see their children and grandchildren. Patient reports having strong support and being taken care of by her family while they were visiting. She reports also actually enjoying the drive to and from [place] and reports she and her husband were able to have good conversations. She reports that has been some improvement in husband's cognitive abilities. Patient reports increased social involvement when she returned home. She reports she has been regularly practicing controlled breathing. She reports no longer feeling tense in the mornings.  Target Goals:   1. Implement changes in time and effort allocation to restore balance to life.    2. Implement mindfulness techniques to cope with stress, anxiety and depression.  Last Reviewed:   08/31/2016  Goals Addressed Today:    1.2  Plan:      Return again in 2 weeks.  Impression/Diagnosis:   Patient is referred for services by PA Pearson Forster due to pateint suffering from symptoms of anxiety and depression. She first began experiencing symptoms when she was diagnosed with tongue cancer in 2012. She began taking zoloft for depression after husband had heart surgery when patient was 66 years old. She began taking xanax  for anxiety in 2012. She reports going into a deep depression at the three-year recovery mark from tongue cancer. Symptoms worsened in  April 2018 when her husband suffered a stroke. Current symptoms include crying spells, feelings of hopelessness/helplessness, anxiety, isolation, poor motivation, diminished interest in activities, and excessive worry.    Diagnosis:  Axis I: Major depressive disorder, recurrent, moderate          Axis II: Deferred    Eryka Dolinger, LCSW 10/12/2016

## 2016-11-07 ENCOUNTER — Ambulatory Visit (HOSPITAL_COMMUNITY): Payer: Self-pay | Admitting: Psychiatry

## 2016-11-21 ENCOUNTER — Telehealth (HOSPITAL_COMMUNITY): Payer: Self-pay | Admitting: *Deleted

## 2016-11-21 ENCOUNTER — Ambulatory Visit (HOSPITAL_COMMUNITY): Payer: Medicare Other | Admitting: Psychiatry

## 2016-11-21 NOTE — Telephone Encounter (Signed)
left voice, provider out of office.

## 2016-12-05 ENCOUNTER — Telehealth: Payer: Self-pay | Admitting: Family Medicine

## 2016-12-05 ENCOUNTER — Other Ambulatory Visit: Payer: Self-pay | Admitting: Nurse Practitioner

## 2016-12-05 ENCOUNTER — Telehealth: Payer: Self-pay | Admitting: Nurse Practitioner

## 2016-12-05 MED ORDER — ZOLPIDEM TARTRATE 10 MG PO TABS: 10.0000 mg | ORAL_TABLET | Freq: Every evening | ORAL | 2 refills | Status: DC | PRN

## 2016-12-05 MED ORDER — ALPRAZOLAM 1 MG PO TABS: 1.0000 mg | ORAL_TABLET | Freq: Two times a day (BID) | ORAL | 2 refills | Status: DC | PRN

## 2016-12-05 MED ORDER — HYDROCODONE-ACETAMINOPHEN 5-325 MG PO TABS: ORAL_TABLET | ORAL | 0 refills | Status: DC

## 2016-12-05 NOTE — Telephone Encounter (Signed)
Refills done. Please have patient follow up in November for recheck. Thanks.

## 2016-12-05 NOTE — Telephone Encounter (Signed)
Spoke with patient and informed her per [place] done. Patient verbalized understanding. (has appointment scheduled in November)

## 2016-12-05 NOTE — Telephone Encounter (Signed)
Pt is needing refills on HYDROcodone-acetaminophen (NORCO/VICODIN) 5-325 MG tablet  ALPRAZolam (XANAX) 1 MG tablet  zolpidem (AMBIEN) 10 MG tablet  [facility]

## 2016-12-05 NOTE — Telephone Encounter (Signed)
[place] reviewed today for refills of controlled substances.

## 2016-12-05 NOTE — Telephone Encounter (Signed)
Hoyle Sauer to see (has been working with pt)

## 2017-01-08 ENCOUNTER — Other Ambulatory Visit: Payer: Self-pay | Admitting: Family Medicine

## 2017-01-09 ENCOUNTER — Ambulatory Visit (INDEPENDENT_AMBULATORY_CARE_PROVIDER_SITE_OTHER): Payer: Medicare Other | Admitting: Nurse Practitioner

## 2017-01-09 ENCOUNTER — Encounter: Payer: Self-pay | Admitting: Nurse Practitioner

## 2017-01-09 VITALS — BP 170/110 | Ht 64.0 in | Wt 139.0 lb

## 2017-01-09 DIAGNOSIS — R030 Elevated blood-pressure reading, without diagnosis of hypertension: Secondary | ICD-10-CM

## 2017-01-09 DIAGNOSIS — Z23 Encounter for immunization: Secondary | ICD-10-CM

## 2017-01-09 DIAGNOSIS — Z79891 Long term (current) use of opiate analgesic: Secondary | ICD-10-CM

## 2017-01-09 DIAGNOSIS — E039 Hypothyroidism, unspecified: Secondary | ICD-10-CM

## 2017-01-09 MED ORDER — HYDROCODONE-ACETAMINOPHEN 5-325 MG PO TABS: ORAL_TABLET | ORAL | 0 refills | Status: DC

## 2017-01-09 NOTE — Progress Notes (Signed)
Subjective: This patient was seen today for chronic pain  The medication list was reviewed and updated.   -Compliance with medication: yes  - Number patient states they take daily: three times per day  -when was the last dose patient took? This am   The patient was advised the importance of maintaining medication and not using illegal substances with these.  Refills needed: yes  The patient was educated that we can provide 3 monthly scripts for their medication, it is their responsibility to follow the instructions.  Side effects or complications from medications: none  Patient is aware that pain medications are meant to minimize the severity of the pain to allow their pain levels to improve to allow for better function. They are aware of that pain medications cannot totally remove their pain.  Due for UDT ( at least once per year) : today  Pain med provides partial relief and only briefly.  Admits to taking 2 of her friends oxycodone one day when the pain was intense.  Cut the pill in fourths due to the drowsiness.  States that she cannot be drowsy because she takes care of her husband who has a disability.  Has not taken any outside medications since then.  This was 3 weeks ago. Compliant with metoprolol.  States she is upset today because of some disturbing news from a friend.  Does not check her blood pressure outside the office.  No chest pain/ischemic type pain or shortness of breath.  No TIA symptoms.  Objective: NAD.  Alert, oriented.  Lungs clear.  Heart regular rate and rhythm.  No murmur or gallop noted.  Carotids no bruits or thrills.  BP per nurse including recheck 170/110.  Assessment: Problem List Items Addressed This Visit      Other   Encounter for long-term opiate analgesic use - Primary   Relevant Orders   ToxASSURE Select 13 (MW), Urine    Other Visit Diagnoses    Hypothyroidism, unspecified type       Relevant Orders   TSH (Completed)   Need for influenza  vaccination       Relevant Orders   Flu Vaccine QUAD 36+ mos IM (Completed)   Elevated BP without diagnosis of hypertension         Plan: Meds ordered this encounter  Medications  . DISCONTD: HYDROcodone-acetaminophen (NORCO/VICODIN) 5-325 MG tablet    Sig: Take one tab 3 times a day prn pain    Dispense:  90 tablet    Refill:  0    Order Specific Question:   Supervising Provider    Answer:   Mikey Kirschner [[id number]]  . DISCONTD: HYDROcodone-acetaminophen (NORCO/VICODIN) 5-325 MG tablet    Sig: Take one tab 3 times a day prn pain    Dispense:  90 tablet    Refill:  0    May refill in 30 days from 01/09/17    Order Specific Question:   Supervising Provider    Answer:   Mikey Kirschner [[id number]]  . HYDROcodone-acetaminophen (NORCO/VICODIN) 5-325 MG tablet    Sig: Take one tab 3 times a day prn pain    Dispense:  90 tablet    Refill:  0    May refill in 60 days from 01/09/17    Order Specific Question:   Supervising Provider    Answer:   Mikey Kirschner [[id number]]   Patient defers medication for blood pressure at this time or an increase in her metoprolol.  Agrees to  check her blood pressure outside of the office over the next couple of weeks and if it remains above 140/90 she will contact the office.  The lengthy discussion regarding taking only her medications and not combining with other peoples medications.  Discussed risk of respiratory issues taking Xanax hydrocodone and Ambien.  The patient is taking these meds long-term without difficulty.  Explained that we cannot increase her hydrocodone dosing unless we cut back on her other medications specifically Xanax.  Patient does not wish to change any dosing at this time.  Recommend preventive health physical.  TSH pending.  Pneumonia and flu vaccines today. Return in about 3 months (around 04/11/2017) for pain management. Call back sooner if any problems.

## 2017-01-09 NOTE — Patient Instructions (Signed)
Shingrix

## 2017-01-10 ENCOUNTER — Other Ambulatory Visit: Payer: Self-pay | Admitting: Nurse Practitioner

## 2017-01-10 LAB — TSH: TSH: 0.038 u[IU]/mL — ABNORMAL LOW (ref 0.450–4.500)

## 2017-01-10 MED ORDER — LEVOTHYROXINE SODIUM 112 MCG PO TABS: 112.0000 ug | ORAL_TABLET | Freq: Every day | ORAL | 0 refills | Status: DC

## 2017-01-11 ENCOUNTER — Encounter: Payer: Self-pay | Admitting: Nurse Practitioner

## 2017-01-11 DIAGNOSIS — R030 Elevated blood-pressure reading, without diagnosis of hypertension: Secondary | ICD-10-CM | POA: Insufficient documentation

## 2017-01-14 LAB — TOXASSURE SELECT 13 (MW), URINE

## 2017-02-16 ENCOUNTER — Encounter: Payer: Self-pay | Admitting: Nurse Practitioner

## 2017-02-16 ENCOUNTER — Ambulatory Visit (INDEPENDENT_AMBULATORY_CARE_PROVIDER_SITE_OTHER): Payer: Medicare Other | Admitting: Nurse Practitioner

## 2017-02-16 VITALS — BP 132/86 | Ht 64.0 in | Wt 140.0 lb

## 2017-02-16 DIAGNOSIS — F418 Other specified anxiety disorders: Secondary | ICD-10-CM

## 2017-02-16 NOTE — Patient Instructions (Signed)
Increase Zoloft 50 mg to 2 tabs each day (100 mg) Contact office in 2-3 weeks to let us know how it is going

## 2017-02-17 ENCOUNTER — Encounter: Payer: Self-pay | Admitting: Nurse Practitioner

## 2017-02-17 NOTE — Progress Notes (Signed)
Subjective: Presents for recheck on her anxiety and depression.  Had been doing better for a while but has been under tremendous stress dealing with her husband who has major physical issues as well as some confusion and mild dementia.  Her main support and family unit live up [place].  Does have a strong support system here with her friends.  No suicidal thoughts or ideation.  Has been on Zoloft for a long time, no changes in her dosing.  Has tolerated well.  Stopped her counseling because she did not feel it was of any benefit. Depression screen [facility] 2/9 02/17/2017 01/09/2017  Decreased Interest 3 1  Down, Depressed, Hopeless 3 1  PHQ - 2 Score 6 2  Altered sleeping 3 1  Tired, decreased energy 2 1  Change in appetite 1 0  Feeling bad or failure about yourself  3 0  Trouble concentrating 2 0  Moving slowly or fidgety/restless 1 0  Suicidal thoughts 0 0  PHQ-9 Score 18 4  Difficult doing work/chores Extremely dIfficult Somewhat difficult     Objective:   BP 132/86   Ht 5\' 4"  (1.626 m)   Wt 140 lb (63.5 kg)   BMI 24.03 kg/m  NAD.  Alert, oriented.  Mildly anxious affect.  Thoughts logical coherent and relevant.  Dressed appropriately.  Making good eye contact.  Lungs clear.  Heart regular rate and rhythm.  Assessment:   Problem List Items Addressed This Visit      Other   Anxiety with depression - Primary       Plan: Discussed options.  Increase Zoloft to 100 mg/day, take 2 of her 50 mg tablets.  Go back to previous dose if any problems and contact the office.  Let us know how the new dosage is working.  Our plan is to increase this over time if needed and if no improvement look at other options. Follow-up on 2/13 as planned, call back sooner if any problems.

## 2017-03-03 ENCOUNTER — Other Ambulatory Visit: Payer: Self-pay | Admitting: *Deleted

## 2017-03-03 MED ORDER — LEVOTHYROXINE SODIUM 112 MCG PO TABS: 112.0000 ug | ORAL_TABLET | Freq: Every day | ORAL | 0 refills | Status: DC

## 2017-03-08 ENCOUNTER — Other Ambulatory Visit: Payer: Self-pay | Admitting: Nurse Practitioner

## 2017-03-09 ENCOUNTER — Other Ambulatory Visit: Payer: Self-pay

## 2017-03-09 MED ORDER — ZOLPIDEM TARTRATE 10 MG PO TABS: 10.0000 mg | ORAL_TABLET | Freq: Every evening | ORAL | 5 refills | Status: DC | PRN

## 2017-03-09 NOTE — Telephone Encounter (Signed)
Six mo worth 

## 2017-04-05 ENCOUNTER — Encounter: Payer: Self-pay | Admitting: Nurse Practitioner

## 2017-04-05 ENCOUNTER — Ambulatory Visit (INDEPENDENT_AMBULATORY_CARE_PROVIDER_SITE_OTHER): Payer: Medicare Other | Admitting: Nurse Practitioner

## 2017-04-05 VITALS — BP 160/100 | Ht 64.0 in | Wt 141.0 lb

## 2017-04-05 DIAGNOSIS — Z79891 Long term (current) use of opiate analgesic: Secondary | ICD-10-CM

## 2017-04-05 DIAGNOSIS — F418 Other specified anxiety disorders: Secondary | ICD-10-CM | POA: Diagnosis not present

## 2017-04-05 MED ORDER — HYDROCODONE-ACETAMINOPHEN 5-325 MG PO TABS: ORAL_TABLET | ORAL | 0 refills | Status: DC

## 2017-04-05 NOTE — Progress Notes (Signed)
Subjective: This patient was seen today for chronic pain  The medication list was reviewed and updated.   -Compliance with medication: yes  - Number patient states they take daily: 3  -when was the last dose patient took? This am  The patient was advised the importance of maintaining medication and not using illegal substances with these.  Here for refills and follow up  The patient was educated that we can provide 3 monthly scripts for their medication, it is their responsibility to follow the instructions.  Side effects or complications from medications: none   Patient is aware that pain medications are meant to minimize the severity of the pain to allow their pain levels to improve to allow for better function. They are aware of that pain medications cannot totally remove their pain.  Due for UDT ( at least once per year) : UTD PMP reviewed. Has chronic pain in the mouth and face from reconstruction and radiation after cancer. Overall well controlled with current regimen. Increased her Zoloft to 100 mg per day which is working much better. Has struggled more over the past 2 weeks due to husband's condition.   Objective: NAD. Alert, oriented. Lungs clear. Heart RRR. Carotids no bruits or thrills. BP 160/100.   Assessment:  Problem List Items Addressed This Visit      Other   Anxiety with depression   Encounter for long-term opiate analgesic use - Primary     Plan:  Meds ordered this encounter  Medications  . DISCONTD: HYDROcodone-acetaminophen (NORCO/VICODIN) 5-325 MG tablet    Sig: Take one tab 3 times a day prn pain    Dispense:  90 tablet    Refill:  0    May refill in 60 days from 04/05/17    Order Specific Question:   Supervising Provider    Answer:   Mikey Kirschner [[id number]]  . DISCONTD: HYDROcodone-acetaminophen (NORCO/VICODIN) 5-325 MG tablet    Sig: Take one tab 3 times a day prn pain    Dispense:  90 tablet    Refill:  0    May refill in 30 days from 04/05/17     Order Specific Question:   Supervising Provider    Answer:   Mikey Kirschner [[id number]]  . HYDROcodone-acetaminophen (NORCO/VICODIN) 5-325 MG tablet    Sig: Take one tab 3 times a day prn pain    Dispense:  90 tablet    Refill:  0    Order Specific Question:   Supervising Provider    Answer:   Mikey Kirschner [[id number]]  . sertraline (ZOLOFT) 100 MG tablet    Sig: Take 1 tablet (100 mg total) by mouth daily.    Dispense:  30 tablet    Refill:  5    Order Specific Question:   Supervising Provider    Answer:   Mikey Kirschner [[id number]]   Return in about 3 months (around 07/03/2017) for pain management. Declines all health maintenance and PE.  States BP outside of the office is normal. Has social anxiety. Defers further intervention for now.

## 2017-04-07 ENCOUNTER — Other Ambulatory Visit: Payer: Self-pay

## 2017-04-07 ENCOUNTER — Other Ambulatory Visit: Payer: Self-pay | Admitting: Nurse Practitioner

## 2017-04-07 ENCOUNTER — Encounter: Payer: Self-pay | Admitting: Nurse Practitioner

## 2017-04-07 MED ORDER — SERTRALINE HCL 100 MG PO TABS: 100.0000 mg | ORAL_TABLET | Freq: Every day | ORAL | 5 refills | Status: DC

## 2017-04-07 MED ORDER — ALPRAZOLAM 1 MG PO TABS: 1.0000 mg | ORAL_TABLET | Freq: Two times a day (BID) | ORAL | 2 refills | Status: DC | PRN

## 2017-04-12 ENCOUNTER — Other Ambulatory Visit: Payer: Self-pay | Admitting: Nurse Practitioner

## 2017-04-13 ENCOUNTER — Other Ambulatory Visit: Payer: Self-pay | Admitting: Family Medicine

## 2017-04-13 ENCOUNTER — Other Ambulatory Visit: Payer: Self-pay

## 2017-04-13 DIAGNOSIS — E039 Hypothyroidism, unspecified: Secondary | ICD-10-CM | POA: Diagnosis not present

## 2017-04-13 MED ORDER — ALPRAZOLAM 1 MG PO TABS: 1.0000 mg | ORAL_TABLET | Freq: Two times a day (BID) | ORAL | 2 refills | Status: DC | PRN

## 2017-04-13 NOTE — Progress Notes (Unsigned)
thy

## 2017-04-14 LAB — TSH: TSH: 0.765 u[IU]/mL (ref 0.450–4.500)

## 2017-05-16 ENCOUNTER — Other Ambulatory Visit: Payer: Self-pay | Admitting: Nurse Practitioner

## 2017-05-16 ENCOUNTER — Telehealth: Payer: Self-pay | Admitting: Family Medicine

## 2017-05-16 MED ORDER — HYDROCODONE-ACETAMINOPHEN 5-325 MG PO TABS: ORAL_TABLET | ORAL | 0 refills | Status: DC

## 2017-05-16 NOTE — Telephone Encounter (Signed)
Seen 04/05/17 for pain management

## 2017-05-16 NOTE — Telephone Encounter (Signed)
Done

## 2017-05-16 NOTE — Telephone Encounter (Signed)
Pt is requesting a refill on  HYDROcodone-acetaminophen (NORCO/VICODIN) 5-325 MG tablet    [facility]

## 2017-05-22 ENCOUNTER — Other Ambulatory Visit: Payer: Self-pay | Admitting: Nurse Practitioner

## 2017-05-22 ENCOUNTER — Telehealth: Payer: Self-pay | Admitting: Nurse Practitioner

## 2017-05-22 MED ORDER — SERTRALINE HCL 50 MG PO TABS: 100.0000 mg | ORAL_TABLET | Freq: Every day | ORAL | 5 refills | Status: DC

## 2017-05-22 NOTE — Telephone Encounter (Signed)
Sent in as requested 

## 2017-05-22 NOTE — Telephone Encounter (Signed)
Pt called stating that the Zoloft 100 mg tablet is too big for her to swallow. Pt states that she tried cutting it into 4's but even then it gets stuck and burns. Pt wants to know if she can get a prescription for the 50 mg two a day because she was able to handle those. Please advise.   [facility]

## 2017-06-13 ENCOUNTER — Telehealth: Payer: Self-pay | Admitting: *Deleted

## 2017-06-14 ENCOUNTER — Other Ambulatory Visit: Payer: Self-pay | Admitting: Nurse Practitioner

## 2017-06-14 ENCOUNTER — Other Ambulatory Visit: Payer: Self-pay | Admitting: *Deleted

## 2017-06-14 MED ORDER — SERTRALINE HCL 50 MG PO TABS: 100.0000 mg | ORAL_TABLET | Freq: Every day | ORAL | 1 refills | Status: DC

## 2017-06-14 NOTE — Telephone Encounter (Signed)
Sertraline sent in 90 d supply to [facility]

## 2017-07-02 ENCOUNTER — Other Ambulatory Visit: Payer: Self-pay | Admitting: Family Medicine

## 2017-07-03 ENCOUNTER — Encounter: Payer: Self-pay | Admitting: Nurse Practitioner

## 2017-07-03 ENCOUNTER — Ambulatory Visit (INDEPENDENT_AMBULATORY_CARE_PROVIDER_SITE_OTHER): Payer: Medicare Other | Admitting: Nurse Practitioner

## 2017-07-03 VITALS — BP 134/88 | Ht 64.0 in | Wt 148.0 lb

## 2017-07-03 DIAGNOSIS — F418 Other specified anxiety disorders: Secondary | ICD-10-CM | POA: Diagnosis not present

## 2017-07-03 DIAGNOSIS — G4700 Insomnia, unspecified: Secondary | ICD-10-CM | POA: Diagnosis not present

## 2017-07-03 DIAGNOSIS — Z79891 Long term (current) use of opiate analgesic: Secondary | ICD-10-CM | POA: Diagnosis not present

## 2017-07-03 DIAGNOSIS — B370 Candidal stomatitis: Secondary | ICD-10-CM | POA: Diagnosis not present

## 2017-07-03 DIAGNOSIS — K1379 Other lesions of oral mucosa: Secondary | ICD-10-CM | POA: Diagnosis not present

## 2017-07-03 MED ORDER — HYDROCODONE-ACETAMINOPHEN 5-325 MG PO TABS: ORAL_TABLET | ORAL | 0 refills | Status: DC

## 2017-07-03 MED ORDER — FLUCONAZOLE 100 MG PO TABS: ORAL_TABLET | ORAL | 0 refills | Status: DC

## 2017-07-03 NOTE — Patient Instructions (Signed)
Stop Simvastatin while on Fluconazole

## 2017-07-04 ENCOUNTER — Encounter: Payer: Self-pay | Admitting: Nurse Practitioner

## 2017-07-04 NOTE — Progress Notes (Signed)
Subjective: Presents for routine pain management.  Has had extensive surgery and chronic pain in her mouth following oral cancer and treatment.  Has a history of oral candidiasis although this has not been a problem for a long time.  Began having white spots in her mouth recently which is gotten worse.  Has exacerbated her mouth pain that she normally has.  Describes as feeling on fire all the time.  Has to be careful what foods she eats, has mainly gone to liquid diet supplements.  Average pain is 7-8 out of 10, sometimes worse to the point she is crying.  Has taken other medications such as gabapentin and amitriptyline in the past with no relief of her mouth pain.  After taking hydrocodone 5 mg pain goes to 5/10 which makes it bearable but only for about an hour or so.  Also currently on Xanax which she states is important because of her anxiety related to taking care of her husband with dementia.  Continues to take her Zoloft daily which greatly helps control her anxiety and depression.  also takes Ambien at nighttime for sleep.  States she does not take any of these medications at the same time.  Has not had any problems with excessive drowsiness, in fact does not want to increase the strength of her hydrocodone because she does not want to feel "drugged up".  Has taken her medication today.  Recently went to [place] state to visit her children.  States her husband's dementia has worsened.  Objective:   BP 134/88   Ht 5\' 4"  (1.626 m)   Wt 148 lb (67.1 kg)   BMI 25.40 kg/m  NAD.  Alert, oriented.  Mildly anxious affect.  Lungs clear.  Heart regular rate and rhythm.  Extensive scar tissue noted in the left posterior mouth area.  Several white patches with erythema are noted around the mouth.  Thoughts logical coherent and relevant.  Dressed appropriately.  Making good eye contact.  No evidence of oversedation.  Assessment:   Problem List Items Addressed This Visit      Other   Anxiety with  depression   Encounter for long-term opiate analgesic use - Primary   Insomnia    Other Visit Diagnoses    Mouth pain       Oral candidiasis       Relevant Medications   fluconazole (DIFLUCAN) 100 MG tablet       Plan:   Meds ordered this encounter  Medications  . fluconazole (DIFLUCAN) 100 MG tablet    Sig: Take 2 tabs po the first day then one po qd x 14 d    Dispense:  16 tablet    Refill:  0    Order Specific Question:   Supervising Provider    Answer:   Mikey Kirschner [[id number]]  . DISCONTD: HYDROcodone-acetaminophen (NORCO/VICODIN) 5-325 MG tablet    Sig: Take one tab po QID prn pain    Dispense:  120 tablet    Refill:  0    Order Specific Question:   Supervising Provider    Answer:   Mikey Kirschner [[id number]]  . DISCONTD: HYDROcodone-acetaminophen (NORCO/VICODIN) 5-325 MG tablet    Sig: Take one tab po QID prn pain    Dispense:  120 tablet    Refill:  0    May fill 30 days from 07/03/17    Order Specific Question:   Supervising Provider    Answer:   Mikey Kirschner [[id number]]  .  HYDROcodone-acetaminophen (NORCO/VICODIN) 5-325 MG tablet    Sig: Take one tab po QID prn pain    Dispense:  120 tablet    Refill:  0    May fill 60 days from 07/03/17    Order Specific Question:   Supervising Provider    Answer:   Mikey Kirschner [[id number]]   Start Diflucan as directed.  Call back if oral symptoms persist.  For this pain management cycle will increase her hydrocodone to 4/day maximum.  Do not take the fourth pill if pain is under good control.  Patient agrees with this plan.  Again strongly cautioned her to be careful about taking 3 medicines that can potentially cause drowsiness.  Patient has been on this regimen long-term without difficulty.. Return in about 3 months (around 10/03/2017) for pain management. Call back sooner if needed.  Discussed the importance of stress reduction

## 2017-07-26 ENCOUNTER — Other Ambulatory Visit: Payer: Self-pay | Admitting: Family Medicine

## 2017-08-23 ENCOUNTER — Other Ambulatory Visit: Payer: Self-pay | Admitting: Nurse Practitioner

## 2017-09-04 ENCOUNTER — Other Ambulatory Visit: Payer: Self-pay | Admitting: Nurse Practitioner

## 2017-09-04 ENCOUNTER — Telehealth: Payer: Self-pay | Admitting: Nurse Practitioner

## 2017-09-04 MED ORDER — HYDROCODONE-ACETAMINOPHEN 5-325 MG PO TABS: ORAL_TABLET | ORAL | 0 refills | Status: DC

## 2017-09-04 NOTE — Telephone Encounter (Signed)
Left message to return call. Hoyle Sauer gave pt 3 scripts on may 13th so she should have one to fill now

## 2017-09-04 NOTE — Telephone Encounter (Signed)
I was talking with Cassandra Oliver about billing and she mentioned that she will be coming in with Cassandra Oliver tomorrow for his labs and would need a refill on her pain medication.  I wanted to give someone a heads up.

## 2017-09-04 NOTE — Telephone Encounter (Signed)
Pt returned call. Contact pharmacy to see when last filled. Last filled 07/06/2017. No refills.

## 2017-09-04 NOTE — Progress Notes (Signed)
PMP reviewed; pain med last filled on 07/06/17; refilled today with note to pharmacy to cancel any other Rx if on file

## 2017-09-04 NOTE — Telephone Encounter (Signed)
Refill sent in after reviewing the PMP

## 2017-09-12 ENCOUNTER — Other Ambulatory Visit: Payer: Self-pay | Admitting: Nurse Practitioner

## 2017-09-26 ENCOUNTER — Other Ambulatory Visit: Payer: Self-pay | Admitting: Family Medicine

## 2017-09-26 NOTE — Telephone Encounter (Signed)
Six mo ok 

## 2017-10-02 ENCOUNTER — Ambulatory Visit (INDEPENDENT_AMBULATORY_CARE_PROVIDER_SITE_OTHER): Payer: Medicare Other | Admitting: Family Medicine

## 2017-10-02 ENCOUNTER — Ambulatory Visit: Payer: Medicare Other | Admitting: Nurse Practitioner

## 2017-10-02 ENCOUNTER — Encounter: Payer: Self-pay | Admitting: Family Medicine

## 2017-10-02 VITALS — BP 130/84 | Ht 64.0 in | Wt 153.8 lb

## 2017-10-02 DIAGNOSIS — E785 Hyperlipidemia, unspecified: Secondary | ICD-10-CM | POA: Diagnosis not present

## 2017-10-02 DIAGNOSIS — Z79899 Other long term (current) drug therapy: Secondary | ICD-10-CM

## 2017-10-02 DIAGNOSIS — E039 Hypothyroidism, unspecified: Secondary | ICD-10-CM | POA: Diagnosis not present

## 2017-10-02 DIAGNOSIS — G4700 Insomnia, unspecified: Secondary | ICD-10-CM

## 2017-10-02 DIAGNOSIS — K1379 Other lesions of oral mucosa: Secondary | ICD-10-CM | POA: Diagnosis not present

## 2017-10-02 DIAGNOSIS — R030 Elevated blood-pressure reading, without diagnosis of hypertension: Secondary | ICD-10-CM

## 2017-10-02 DIAGNOSIS — F418 Other specified anxiety disorders: Secondary | ICD-10-CM

## 2017-10-02 MED ORDER — HYDROCODONE-ACETAMINOPHEN 5-325 MG PO TABS: ORAL_TABLET | ORAL | 0 refills | Status: DC

## 2017-10-02 NOTE — Progress Notes (Signed)
   Subjective:    Patient ID: Cassandra Oliver, female    DOB: 13-Jun-1950, 67 y.o.   MRN: [id number]  HPI This patient was seen today for chronic pain  The medication list was reviewed and updated.   -Compliance with medication: yes  - Number patient states they take daily: 3-4  -when was the last dose patient took? today  The patient was advised the importance of maintaining medication and not using illegal substances with these.  Here for refills and follow up  The patient was educated that we can provide 3 monthly scripts for their medication, it is their responsibility to follow the instructions.  Side effects or complications from medications: none  Patient is aware that pain medications are meant to minimize the severity of the pain to allow their pain levels to improve to allow for better function. They are aware of that pain medications cannot totally remove their pain.  Due for UDT ( at least once per year) : 11/18   Patient compliant with pain medication. Continues to experience the pain which led to initiation of analgesic intervention. No significant negative side effects. States definitely needs the pain medication to maintain current level of functioning. Does not receive controlled substance pain medication elsewhere.   Patient continues to take lipid medication regularly. No obvious side effects from it. Generally does not miss a dose. Prior blood work results are reviewed with patient. Patient continues to work on fat intake in diet  Patient compliant with insomnia medication. Generally takes most nights. No obvious morning drowsiness. Definitely helps patient sleep. Without it patient states would not get a good nights rest.   Pt walks a lot, alks the dogs a aari amnt     Pt can not swallow solid tab, grinds foods up, in throat food and pills seems to stick hx of stritures post intervernion    Review of Systems No headache, no major weight loss or weight gain,  no chest pain no back pain abdominal pain no change in bowel habits complete ROS otherwise negative     Objective:   Physical Exam  Alert and oriented, vitals reviewed and stable, NAD ENT-TM's and ext canals WNL bilat via otoscopic exam Soft palate, tonsils and post pharynx WNL via oropharyngeal exam Neck-symmetric, no masses; thyroid nonpalpable and nontender Pulmonary-no tachypnea or accessory muscle use; Clear without wheezes via auscultation Card--no abnrml murmurs, rhythm reg and rate WNL Carotid pulses symmetric, without bruits       Assessment & Plan:  Impression 1 chronic pain.  Oral.  Neuropathic component.  Status post major surgery for tongue cancer.  States definitely needs medications to maintain her level of activity  2.  Insomnia with ongoing need for meds discussed to maintain  3.  Chronic anxiety with element of depression.  On medication.  States medicine overall helpful is completely compliant.  4.  Hyperlipidemia currently on simvastatin  5 hypothyroidism TSH good earlier this year  Recheck in 3 months diet exercise discussed  Impression: Chronic pain. Patient compliant with medication. No substantial side effects. [place] controlled substance registry reviewed to ensure compliance and proper use of medication. Patient aware goal of medicine is not complete resolution of pain but to control his symptoms to improve his functional capacity. Aware of potential adverse side effects  Greater than 50% of this 25 minute face to face visit was spent in counseling and discussion and coordination of care regarding the above diagnosis/diagnosies 6

## 2017-10-02 NOTE — Progress Notes (Deleted)
   Subjective:    Patient ID: Cassandra Oliver, female    DOB: 1951-01-14, 67 y.o.   MRN: [id number]  HPI    Review of Systems     Objective:   Physical Exam        Assessment & Plan:

## 2017-10-31 DIAGNOSIS — H40013 Open angle with borderline findings, low risk, bilateral: Secondary | ICD-10-CM | POA: Diagnosis not present

## 2017-10-31 DIAGNOSIS — H2513 Age-related nuclear cataract, bilateral: Secondary | ICD-10-CM | POA: Diagnosis not present

## 2017-11-23 ENCOUNTER — Other Ambulatory Visit: Payer: Self-pay | Admitting: *Deleted

## 2017-11-24 MED ORDER — ALPRAZOLAM 1 MG PO TABS: ORAL_TABLET | ORAL | 2 refills | Status: DC

## 2017-11-24 NOTE — Telephone Encounter (Signed)
Ok plus two monthly ref 

## 2017-12-29 DIAGNOSIS — E039 Hypothyroidism, unspecified: Secondary | ICD-10-CM | POA: Diagnosis not present

## 2017-12-29 DIAGNOSIS — E785 Hyperlipidemia, unspecified: Secondary | ICD-10-CM | POA: Diagnosis not present

## 2017-12-29 DIAGNOSIS — Z79899 Other long term (current) drug therapy: Secondary | ICD-10-CM | POA: Diagnosis not present

## 2017-12-30 LAB — LIPID PANEL
CHOLESTEROL TOTAL: 181 mg/dL (ref 100–199)
Chol/HDL Ratio: 3.1 ratio (ref 0.0–4.4)
HDL: 59 mg/dL (ref >39)
LDL CALC: 95 mg/dL (ref 0–99)
TRIGLYCERIDES: 133 mg/dL (ref 0–149)
VLDL CHOLESTEROL CAL: 27 mg/dL (ref 5–40)

## 2017-12-30 LAB — BASIC METABOLIC PANEL
BUN/Creatinine Ratio: 14 (ref 12–28)
BUN: 18 mg/dL (ref 8–27)
CALCIUM: 10.3 mg/dL (ref 8.7–10.3)
CHLORIDE: 97 mmol/L (ref 96–106)
CO2: 25 mmol/L (ref 20–29)
Creatinine, Ser: 1.31 mg/dL — ABNORMAL HIGH (ref 0.57–1.00)
GFR calc Af Amer: 49 mL/min/{1.73_m2} — ABNORMAL LOW (ref >59)
GFR calc non Af Amer: 42 mL/min/{1.73_m2} — ABNORMAL LOW (ref >59)
GLUCOSE: 96 mg/dL (ref 65–99)
POTASSIUM: 4.3 mmol/L (ref 3.5–5.2)
SODIUM: 137 mmol/L (ref 134–144)

## 2017-12-30 LAB — HEPATIC FUNCTION PANEL
ALK PHOS: 54 IU/L (ref 39–117)
ALT: 18 IU/L (ref 0–32)
AST: 24 IU/L (ref 0–40)
Albumin: 4.7 g/dL (ref 3.6–4.8)
Bilirubin Total: 0.6 mg/dL (ref 0.0–1.2)
Bilirubin, Direct: 0.14 mg/dL (ref 0.00–0.40)
Total Protein: 7.2 g/dL (ref 6.0–8.5)

## 2017-12-30 LAB — TSH: TSH: 8.27 u[IU]/mL — ABNORMAL HIGH (ref 0.450–4.500)

## 2018-01-02 ENCOUNTER — Ambulatory Visit: Payer: Medicare Other | Admitting: Family Medicine

## 2018-01-04 ENCOUNTER — Encounter: Payer: Self-pay | Admitting: Family Medicine

## 2018-01-04 ENCOUNTER — Ambulatory Visit (INDEPENDENT_AMBULATORY_CARE_PROVIDER_SITE_OTHER): Payer: Medicare Other | Admitting: Family Medicine

## 2018-01-04 VITALS — BP 136/90 | Ht 64.0 in | Wt 155.8 lb

## 2018-01-04 DIAGNOSIS — Z79891 Long term (current) use of opiate analgesic: Secondary | ICD-10-CM

## 2018-01-04 DIAGNOSIS — Z23 Encounter for immunization: Secondary | ICD-10-CM | POA: Diagnosis not present

## 2018-01-04 DIAGNOSIS — K1379 Other lesions of oral mucosa: Secondary | ICD-10-CM | POA: Diagnosis not present

## 2018-01-04 MED ORDER — HYDROCODONE-ACETAMINOPHEN 5-325 MG PO TABS: ORAL_TABLET | ORAL | 0 refills | Status: DC

## 2018-01-04 MED ORDER — SILVER SULFADIAZINE 1 % EX CREA: 1.0000 "application " | TOPICAL_CREAM | Freq: Two times a day (BID) | CUTANEOUS | 0 refills | Status: DC | PRN
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
## 2018-01-09 LAB — SPECIMEN STATUS REPORT

## 2018-01-09 LAB — TOXASSURE SELECT 13 (MW), URINE

## 2018-01-10 ENCOUNTER — Encounter: Payer: Self-pay | Admitting: Family Medicine

## 2018-02-12 ENCOUNTER — Other Ambulatory Visit: Payer: Self-pay | Admitting: Family Medicine

## 2018-02-27 ENCOUNTER — Telehealth: Payer: Self-pay | Admitting: Family Medicine

## 2018-02-27 NOTE — Telephone Encounter (Signed)
Looked in chart and pt had one sent to pharm in November at visit that could be filled jan 13th. Pt notified to check with pharm.

## 2018-02-27 NOTE — Telephone Encounter (Signed)
Patient asking about pain medication while she is here with her husband,

## 2018-03-09 ENCOUNTER — Other Ambulatory Visit: Payer: Self-pay | Admitting: Family Medicine

## 2018-03-12 ENCOUNTER — Other Ambulatory Visit: Payer: Self-pay | Admitting: Family Medicine

## 2018-03-12 NOTE — Telephone Encounter (Signed)
Six mo worth 

## 2018-04-05 ENCOUNTER — Ambulatory Visit (INDEPENDENT_AMBULATORY_CARE_PROVIDER_SITE_OTHER): Payer: Medicare Other | Admitting: Family Medicine

## 2018-04-05 ENCOUNTER — Encounter: Payer: Self-pay | Admitting: Family Medicine

## 2018-04-05 VITALS — BP 132/88 | Ht 64.0 in | Wt 162.0 lb

## 2018-04-05 DIAGNOSIS — G4700 Insomnia, unspecified: Secondary | ICD-10-CM | POA: Diagnosis not present

## 2018-04-05 DIAGNOSIS — F418 Other specified anxiety disorders: Secondary | ICD-10-CM

## 2018-04-05 DIAGNOSIS — Z79891 Long term (current) use of opiate analgesic: Secondary | ICD-10-CM

## 2018-04-05 DIAGNOSIS — K1379 Other lesions of oral mucosa: Secondary | ICD-10-CM | POA: Diagnosis not present

## 2018-04-05 NOTE — Progress Notes (Signed)
   Subjective:    Patient ID: Cassandra Oliver, female    DOB: 1950/02/25, 68 y.o.   MRN: [id number]  HPI This patient was seen today for chronic pain.  Takes hydrocodone 5/325mg  for tongue pain.   The medication list was reviewed and updated.   -Compliance with medication: takes 2 and a half to -3 a day  - Number patient states they take daily: 2 and a a half -3 a day.   -when was the last dose patient took? today  The patient was advised the importance of maintaining medication and not using illegal substances with these.  Here for refills and follow up  The patient was educated that we can provide 3 monthly scripts for their medication, it is their responsibility to follow the instructions.  Side effects or complications from medications: none  Patient is aware that pain medications are meant to minimize the severity of the pain to allow their pain levels to improve to allow for better function. They are aware of that pain medications cannot totally remove their pain.  Due for UDT ( at least once per year) : last one 01/04/18  Pt needs refill on xanax and zolpidem.     Trying to be more active, but pt feeling guilty with spouse demented in homs  Patient compliant with insomnia medication. Generally takes most nights. No obvious morning drowsiness. Definitely helps patient sleep. Without it patient states would not get a good nights rest.  Patient notes ongoing compliance with antidepressant medication. No obvious side effects. Reports does not miss a dose. Overall continues to help depression substantially. No thoughts of homicide or suicide. Would like to maintain medication.  Poor sleeper   Pt had seen ent multi times  And they had no more to offer  Patient is under quite a bit of stress caring for her husband with dementia at home  Chronic pain is significan t seems worse in the weather, and burning substantial   Review of Systems No headache, no major weight loss or  weight gain, no chest pain no back pain abdominal pain no change in bowel habits complete ROS otherwise negative     Objective:   Physical Exam  Alert and oriented, vitals reviewed and stable, NAD ENT-TM's and ext canals WNL bilat via otoscopic exam Soft palate, tonsils and post pharynx WNL via oropharyngeal exam Neck-symmetric, no masses; thyroid nonpalpable and nontender Pulmonary-no tachypnea or accessory muscle use; Clear without wheezes via auscultation Card--no abnrml murmurs, rhythm reg and rate WNL Carotid pulses symmetric, without bruits       Assessment & Plan:  #1 depression with element of anxiety.  Ongoing.  Potential interventions regarding stress with demented husband discussed continue meds  2.  Insomnia" ongoing need for meds discussed  3.  Chronic pain  Impression: Chronic pain. Patient compliant with medication. No substantial side effects. [place] controlled substance registry reviewed to ensure compliance and proper use of medication. Patient aware goal of medicine is not complete resolution of pain but to control his symptoms to improve his functional capacity. Aware of potential adverse side effects

## 2018-04-08 MED ORDER — ZOLPIDEM TARTRATE 10 MG PO TABS: 10.0000 mg | ORAL_TABLET | Freq: Every evening | ORAL | 5 refills | Status: DC | PRN

## 2018-04-08 MED ORDER — HYDROCODONE-ACETAMINOPHEN 5-325 MG PO TABS: ORAL_TABLET | ORAL | 0 refills | Status: DC

## 2018-05-01 ENCOUNTER — Other Ambulatory Visit: Payer: Self-pay | Admitting: Family Medicine

## 2018-07-04 ENCOUNTER — Ambulatory Visit: Payer: Medicare Other | Admitting: Family Medicine

## 2018-07-23 ENCOUNTER — Other Ambulatory Visit: Payer: Self-pay | Admitting: Family Medicine

## 2018-08-28 ENCOUNTER — Ambulatory Visit (INDEPENDENT_AMBULATORY_CARE_PROVIDER_SITE_OTHER): Payer: Medicare Other | Admitting: Family Medicine

## 2018-08-28 ENCOUNTER — Other Ambulatory Visit: Payer: Self-pay

## 2018-08-28 DIAGNOSIS — F418 Other specified anxiety disorders: Secondary | ICD-10-CM

## 2018-08-28 DIAGNOSIS — G4700 Insomnia, unspecified: Secondary | ICD-10-CM | POA: Diagnosis not present

## 2018-08-28 DIAGNOSIS — K1379 Other lesions of oral mucosa: Secondary | ICD-10-CM

## 2018-08-28 MED ORDER — HYDROCODONE-ACETAMINOPHEN 5-325 MG PO TABS: ORAL_TABLET | ORAL | 0 refills | Status: DC

## 2018-08-28 NOTE — Progress Notes (Signed)
Subjective:    Patient ID: Cassandra Oliver, female    DOB: [birth year], 68 y.o.   MRN: [id number] Patient presents with numerous concerns  Audio plus visual HPI  This patient was seen today for chronic pain  The medication list was reviewed and updated.   -Compliance with medication: yes  - Number patient states they take daily: 3  -when was the last dose patient took? today  The patient was advised the importance of maintaining medication and not using illegal substances with these.  Here for refills and follow up  The patient was educated that we can provide 3 monthly scripts for their medication, it is their responsibility to follow the instructions.  Side effects or complications from medications: none  Patient is aware that pain medications are meant to minimize the severity of the pain to allow their pain levels to improve to allow for better function. They are aware of that pain medications cannot totally remove their pain.  Virtual Visit via Video Note  I connected with Cassandra Oliver on 08/28/18 at 11:00 AM EDT by a video enabled telemedicine application and verified that I am speaking with the correct person using two identifiers.  Location: Patient: home Provider: office   I discussed the limitations of evaluation and management by telemedicine and the availability of in person appointments. The patient expressed understanding and agreed to proceed.  History of Present Illness:    Observations/Objective:   Assessment and Plan:   Follow Up Instructions:    I discussed the assessment and treatment plan with the patient. The patient was provided an opportunity to ask questions and all were answered. The patient agreed with the plan and demonstrated an understanding of the instructions.   The patient was advised to call back or seek an in-person evaluation if the symptoms worsen or if the condition fails to improve as anticipated.  I provided 25 minutes of  non-face-to-face time during this encounter.    Patient compliant with pain medication. Continues to experience the pain which led to initiation of analgesic intervention. No significant negative side effects. States definitely needs the pain medication to maintain current level of functioning. Does not receive controlled substance pain medication elsewhere.  Patient notes ongoing compliance with antidepressant medication. No obvious side effects. Reports does not miss a dose. Overall continues to help depression substantially. No thoughts of homicide or suicide. Would like to maintain medication.  Ongoing substantial anxiety.  Ongoing substantial stress.  Patient compliant with medications.  Socially isolated with pandemic.  Socially isolated with her children 100s of miles away and she not wanting to move back       Review of Systems No headache, no major weight loss or weight gain, no chest pain no back pain abdominal pain no change in bowel habits complete ROS otherwise negative     Objective:   Physical Exam   Virtual     Assessment & Plan:  Impression 1 chronic pain Impression: Chronic pain. Patient compliant with medication. No substantial side effects. [place] controlled substance registry reviewed to ensure compliance and proper use of medication. Patient aware goal of medicine is not complete resolution of pain but to control his symptoms to improve his functional capacity. Aware of potential adverse side effects  #2 anxiety with depression and acute stress.  Long discussion held.  Compliance discussed.  Exercise discussed.  Gently offered and recommended regular counseling.  Patient was not 1 to do this at this time.  She is tried  counseling in the past with only so-so results.  Greater than 50% of this 25 minute face to face visit was spent in counseling and discussion and coordination of care regarding the above diagnosis/diagnosies

## 2018-09-11 ENCOUNTER — Other Ambulatory Visit: Payer: Self-pay | Admitting: Family Medicine

## 2018-09-11 NOTE — Telephone Encounter (Signed)
Six mo worth 

## 2018-09-12 ENCOUNTER — Other Ambulatory Visit: Payer: Self-pay | Admitting: Family Medicine

## 2018-09-12 NOTE — Telephone Encounter (Signed)
Six mo worth 

## 2018-09-28 ENCOUNTER — Other Ambulatory Visit: Payer: Self-pay | Admitting: Family Medicine

## 2018-10-15 ENCOUNTER — Other Ambulatory Visit: Payer: Self-pay | Admitting: Family Medicine

## 2018-10-15 NOTE — Telephone Encounter (Signed)
Six mo ok 

## 2018-11-21 ENCOUNTER — Other Ambulatory Visit: Payer: Self-pay | Admitting: Family Medicine

## 2018-11-30 ENCOUNTER — Encounter: Payer: Self-pay | Admitting: Nurse Practitioner

## 2018-11-30 ENCOUNTER — Ambulatory Visit (INDEPENDENT_AMBULATORY_CARE_PROVIDER_SITE_OTHER): Payer: Medicare Other | Admitting: Nurse Practitioner

## 2018-11-30 ENCOUNTER — Other Ambulatory Visit: Payer: Self-pay

## 2018-11-30 VITALS — BP 144/90 | Temp 97.7°F | Wt 142.8 lb

## 2018-11-30 DIAGNOSIS — K1379 Other lesions of oral mucosa: Secondary | ICD-10-CM | POA: Diagnosis not present

## 2018-11-30 DIAGNOSIS — R030 Elevated blood-pressure reading, without diagnosis of hypertension: Secondary | ICD-10-CM

## 2018-11-30 DIAGNOSIS — F418 Other specified anxiety disorders: Secondary | ICD-10-CM | POA: Diagnosis not present

## 2018-11-30 DIAGNOSIS — E039 Hypothyroidism, unspecified: Secondary | ICD-10-CM

## 2018-11-30 DIAGNOSIS — E785 Hyperlipidemia, unspecified: Secondary | ICD-10-CM

## 2018-11-30 DIAGNOSIS — Z79899 Other long term (current) drug therapy: Secondary | ICD-10-CM

## 2018-11-30 DIAGNOSIS — Z79891 Long term (current) use of opiate analgesic: Secondary | ICD-10-CM

## 2018-11-30 DIAGNOSIS — Z23 Encounter for immunization: Secondary | ICD-10-CM | POA: Diagnosis not present

## 2018-11-30 NOTE — Progress Notes (Signed)
Subjective:    Patient ID: Cassandra Oliver, female    DOB: [birth year], 68 y.o.   MRN: [id number]  HPI This patient was seen today for chronic pain  The medication list was reviewed and updated.   -Compliance with medication: hydrocodone 5-325   - Number patient states they take daily: 3 (1/2 pill at a time); tries to not take the 4th pill   -when was the last dose patient took?  Half a pill about a hour ago   The patient was advised the importance of maintaining medication and not using illegal substances with these.  Here for refills and follow up  The patient was educated that we can provide 3 monthly scripts for their medication, it is their responsibility to follow the instructions.  Side effects or complications from medications: none  Patient is aware that pain medications are meant to minimize the severity of the pain to allow their pain levels to improve to allow for better function. They are aware of that pain medications cannot totally remove their pain.  Due for UDT ( at least once per year) : today  Patient remains under tremendous stress due to her husband's physical condition.  Continues to have mouth pain more when she has to talk due to straining of the scar tissue from her previous surgery.  Her pain medicine does not completely take care of the pain but does reduce it enough where she can function and do her daily activities.  Did not see much improvement on the Zoloft and has since stopped this.  Does continue her Xanax daily as prescribed which helps control her anxiety and stress.  Is due for her routine lab work.  States she is taking her thyroid medicine on a daily basis.  Adherent to medication regimen.     Review of Systems     Objective:   Physical Exam NAD.  Alert, oriented.  Thoughts logical coherent and relevant.  Calm affect.  Lungs clear.  Heart regular rate rhythm.       Assessment & Plan:   Problem List Items Addressed This Visit      Endocrine   Hypothyroidism   Relevant Orders   TSH     Other   Anxiety with depression   Elevated BP without diagnosis of hypertension   Relevant Orders   Basic Metabolic Panel (BMET)   Lipid Profile   Hepatic function panel   TSH   Encounter for long-term opiate analgesic use   Relevant Orders   ToxASSURE Select 13 (MW), Urine   Mouth pain - Primary    Other Visit Diagnoses    Need for vaccination       Relevant Orders   Pneumococcal conjugate vaccine 13-valent (Completed)   Flu Vaccine QUAD 6+ mos PF IM (Fluarix Quad PF) (Completed)   High risk medication use       Relevant Orders   Basic Metabolic Panel (BMET)   Lipid Profile   Hepatic function panel   TSH   Hyperlipidemia, unspecified hyperlipidemia type       Relevant Orders   Basic Metabolic Panel (BMET)   Lipid Profile   Hepatic function panel   TSH     Meds ordered this encounter  Medications  . DISCONTD: HYDROcodone-acetaminophen (NORCO/VICODIN) 5-325 MG tablet    Sig: Take one tab po up to QID prn for pain. With the goal of only taking TID prn.    Dispense:  100 tablet    Refill:  0  Order Specific Question:   Supervising Provider    Answer:   Sallee Lange A [[id number]]  . DISCONTD: HYDROcodone-acetaminophen (NORCO/VICODIN) 5-325 MG tablet    Sig: Take one tab po up to QID prn for pain. With the goal of only taking TID prn.    Dispense:  100 tablet    Refill:  0    May fill 30 days from 12/01/18    Order Specific Question:   Supervising Provider    Answer:   Sallee Lange A [[id number]]  . HYDROcodone-acetaminophen (NORCO/VICODIN) 5-325 MG tablet    Sig: Take one tab po up to QID prn for pain. With the goal of only taking TID prn.    Dispense:  100 tablet    Refill:  0    May fill 60 days from 12/01/18    Order Specific Question:   Supervising Provider    Answer:   Sallee Lange A [[id number]]   Flu vaccine and Prevnar 13 vaccine today.  Routine labs ordered.  Continue current regimen. Return in about 3 months  (around 03/02/2019) for pain management.

## 2018-12-01 ENCOUNTER — Encounter: Payer: Self-pay | Admitting: Nurse Practitioner

## 2018-12-01 DIAGNOSIS — K1379 Other lesions of oral mucosa: Secondary | ICD-10-CM | POA: Insufficient documentation

## 2018-12-01 MED ORDER — HYDROCODONE-ACETAMINOPHEN 5-325 MG PO TABS: ORAL_TABLET | ORAL | 0 refills | Status: DC

## 2018-12-03 ENCOUNTER — Telehealth: Payer: Self-pay | Admitting: Family Medicine

## 2018-12-03 MED ORDER — DOXYCYCLINE HYCLATE 100 MG PO TABS: ORAL_TABLET | ORAL | 0 refills | Status: DC

## 2018-12-03 NOTE — Telephone Encounter (Signed)
Pt has white patches in her mouth, states she's had this before & needs doxycycline to clear it up  Please advise & call pt when done    [facility]

## 2018-12-03 NOTE — Telephone Encounter (Signed)
Medication sent to pharmacy. Pt contacted and verbalized understanding.

## 2018-12-03 NOTE — Telephone Encounter (Signed)
Doxy 100 bid ten d 

## 2018-12-03 NOTE — Telephone Encounter (Signed)
Please advise. Thank you

## 2018-12-05 LAB — TOXASSURE SELECT 13 (MW), URINE

## 2018-12-07 ENCOUNTER — Encounter: Payer: Self-pay | Admitting: Family Medicine

## 2018-12-07 ENCOUNTER — Ambulatory Visit (INDEPENDENT_AMBULATORY_CARE_PROVIDER_SITE_OTHER): Payer: Medicare Other | Admitting: Family Medicine

## 2018-12-07 DIAGNOSIS — B370 Candidal stomatitis: Secondary | ICD-10-CM

## 2018-12-07 MED ORDER — FLUCONAZOLE 200 MG PO TABS: 200.0000 mg | ORAL_TABLET | Freq: Every day | ORAL | 0 refills | Status: DC

## 2018-12-07 MED ORDER — FIRST-DUKES MOUTHWASH MT SUSP: OROMUCOSAL | 0 refills | Status: DC

## 2018-12-07 MED ORDER — ONDANSETRON 4 MG PO TBDP: ORAL_TABLET | ORAL | 0 refills | Status: DC

## 2018-12-07 NOTE — Progress Notes (Signed)
   Subjective:    Patient ID: Cassandra Oliver, female    DOB: 17-Jun-1950, 68 y.o.   MRN: [id number]  HPIelevated pulse 103 -110, nausea since last night. Chills started 2 days ago.    Pt states she Has yeast infection in mouth and she thinks that is causing her to have nausea and causing the gagging. Trouble swallowing her pills. States she is Taking doxy for the yeast infection and thinks doxy is causing all of her symptoms.    Virtual Visit via Telephone Note  I connected with Brayden Ledyard on 12/07/18 at  2:00 PM EDT by telephone and verified that I am speaking with the correct person using two identifiers.  Location: Patient: home Provider: office   I discussed the limitations, risks, security and privacy concerns of performing an evaluation and management service by telephone and the availability of in person appointments. I also discussed with the patient that there may be a patient responsible charge related to this service. The patient expressed understanding and agreed to proceed.   History of Present Illness:    Observations/Objective:   Assessment and Plan:   Follow Up Instructions:    I discussed the assessment and treatment plan with the patient. The patient was provided an opportunity to ask questions and all were answered. The patient agreed with the plan and demonstrated an understanding of the instructions.   The patient was advised to call back or seek an in-person evaluation if the symptoms worsen or if the condition fails to improve as anticipated.  I provided 18 minutes of non-face-to-face time during this encounter.  Patient under a lot of ongoing stress.  Sore throat.  White patches.  Looks like thrush.  Also getting nausea.  Feels the Doxy is causing this.  Doxy curiously he has helped this in the past.     Review of Systems No headache, no major weight loss or weight gain, no chest pain no back pain abdominal pain no change in bowel habits complete ROS  otherwise negative     Objective:   Physical Exam  Virtual      Assessment & Plan:  Impression thrush stomatitis.  Stop doxy.  Start Magic mouthwash.  Diflucan daily for 7 days symptom care discussed Zofran as needed warning signs discussed.  Mild elevation of heart rate which comes down

## 2019-03-01 ENCOUNTER — Ambulatory Visit: Payer: Medicare Other | Admitting: Nurse Practitioner

## 2019-03-22 ENCOUNTER — Ambulatory Visit (INDEPENDENT_AMBULATORY_CARE_PROVIDER_SITE_OTHER): Payer: Medicare Other | Admitting: Nurse Practitioner

## 2019-03-22 ENCOUNTER — Other Ambulatory Visit: Payer: Self-pay

## 2019-03-22 DIAGNOSIS — F418 Other specified anxiety disorders: Secondary | ICD-10-CM | POA: Diagnosis not present

## 2019-03-22 DIAGNOSIS — K1379 Other lesions of oral mucosa: Secondary | ICD-10-CM

## 2019-03-22 DIAGNOSIS — Z79891 Long term (current) use of opiate analgesic: Secondary | ICD-10-CM

## 2019-03-22 DIAGNOSIS — G4700 Insomnia, unspecified: Secondary | ICD-10-CM | POA: Diagnosis not present

## 2019-03-22 MED ORDER — ALPRAZOLAM 1 MG PO TABS: ORAL_TABLET | ORAL | 5 refills | Status: DC

## 2019-03-22 MED ORDER — SERTRALINE HCL 50 MG PO TABS: ORAL_TABLET | ORAL | 2 refills | Status: DC

## 2019-03-22 MED ORDER — ZOLPIDEM TARTRATE 5 MG PO TABS: 5.0000 mg | ORAL_TABLET | Freq: Every evening | ORAL | 5 refills | Status: DC | PRN

## 2019-03-22 MED ORDER — HYDROCODONE-ACETAMINOPHEN 5-325 MG PO TABS: ORAL_TABLET | ORAL | 0 refills | Status: DC
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
## 2019-03-23 ENCOUNTER — Encounter: Payer: Self-pay | Admitting: Nurse Practitioner

## 2019-03-28 ENCOUNTER — Encounter: Payer: Self-pay | Admitting: Family Medicine

## 2019-04-30 ENCOUNTER — Other Ambulatory Visit: Payer: Self-pay | Admitting: Family Medicine

## 2019-05-01 NOTE — Telephone Encounter (Signed)
Last med check up 03/22/19

## 2019-05-07 ENCOUNTER — Other Ambulatory Visit: Payer: Self-pay | Admitting: *Deleted

## 2019-05-07 ENCOUNTER — Telehealth: Payer: Self-pay | Admitting: Family Medicine

## 2019-05-07 MED ORDER — FLUCONAZOLE 100 MG PO TABS: ORAL_TABLET | ORAL | 0 refills | Status: DC

## 2019-05-07 NOTE — Telephone Encounter (Signed)
Med sent to pharm and pt was notified.  °

## 2019-05-07 NOTE — Telephone Encounter (Signed)
White patches in mouth. States she has it off and on for years. Pt thinks fluconazole was easier to swallow than the other med she use to get because the pill was small.   [facility].

## 2019-05-07 NOTE — Telephone Encounter (Signed)
Pt would like something called in for thrush   [facility]

## 2019-05-07 NOTE — Telephone Encounter (Signed)
Fluconazole 100 mg, two day one, then one tab daily for 14 days

## 2019-05-31 ENCOUNTER — Telehealth: Payer: Self-pay | Admitting: Family Medicine

## 2019-05-31 NOTE — Telephone Encounter (Signed)
Patient is requesting refill on hydrocodone 5/325 called into [facility]

## 2019-06-01 ENCOUNTER — Other Ambulatory Visit: Payer: Self-pay | Admitting: Nurse Practitioner

## 2019-06-01 MED ORDER — HYDROCODONE-ACETAMINOPHEN 5-325 MG PO TABS: ORAL_TABLET | ORAL | 0 refills | Status: DC

## 2019-06-28 ENCOUNTER — Other Ambulatory Visit: Payer: Self-pay

## 2019-06-28 ENCOUNTER — Telehealth: Payer: Self-pay | Admitting: *Deleted

## 2019-06-28 ENCOUNTER — Telehealth (INDEPENDENT_AMBULATORY_CARE_PROVIDER_SITE_OTHER): Payer: Medicare Other | Admitting: Nurse Practitioner

## 2019-06-28 DIAGNOSIS — K1379 Other lesions of oral mucosa: Secondary | ICD-10-CM | POA: Diagnosis not present

## 2019-06-28 DIAGNOSIS — F418 Other specified anxiety disorders: Secondary | ICD-10-CM | POA: Diagnosis not present

## 2019-06-28 DIAGNOSIS — Z79891 Long term (current) use of opiate analgesic: Secondary | ICD-10-CM | POA: Diagnosis not present

## 2019-06-28 MED ORDER — HYDROCODONE-ACETAMINOPHEN 5-325 MG PO TABS: ORAL_TABLET | ORAL | 0 refills | Status: DC

## 2019-06-28 NOTE — Telephone Encounter (Signed)
Ms. Cassandra Oliver, Cassandra Oliver are scheduled for a virtual visit with your provider today.    Just as we do with appointments in the office, we must obtain your consent to participate.  Your consent will be active for this visit and any virtual visit you may have with one of our providers in the next 365 days.    If you have a MyChart account, I can also send a copy of this consent to you electronically.  All virtual visits are billed to your insurance company just like a traditional visit in the office.  As this is a virtual visit, video technology does not allow for your provider to perform a traditional examination.  This may limit your provider's ability to fully assess your condition.  If your provider identifies any concerns that need to be evaluated in person or the need to arrange testing such as labs, EKG, etc, we will make arrangements to do so.    Although advances in technology are sophisticated, we cannot ensure that it will always work on either your end or our end.  If the connection with a video visit is poor, we may have to switch to a telephone visit.  With either a video or telephone visit, we are not always able to ensure that we have a secure connection.   I need to obtain your verbal consent now.   Are you willing to proceed with your visit today?   Laurence Mankins has provided verbal consent on 06/28/2019 for a virtual visit (video or telephone).   Mitzie Na, RN 06/28/2019  3:17 PM

## 2019-06-28 NOTE — Progress Notes (Signed)
Subjective:    Patient ID: Cassandra Oliver, female    DOB: [birth year], 69 y.o.   MRN: [id number]  HPI  This patient was seen today for chronic pain  The medication list was reviewed and updated.   -Compliance with medication: yes  - Number patient states they take daily: 3-4; most days only 3 pills; occasionally takes 4th one.   -when was the last dose patient took? today  The patient was advised the importance of maintaining medication and not using illegal substances with these.  Here for refills and follow up  The patient was educated that we can provide 3 monthly scripts for their medication, it is their responsibility to follow the instructions.  Side effects or complications from medications: none  Patient is aware that pain medications are meant to minimize the severity of the pain to allow their pain levels to improve to allow for better function. They are aware of that pain medications cannot totally remove their pain.  Chronic mouth pain controlled with current regimen.   UDT: 11/30/18   Virtual Visit via Video Note  I connected with Cassandra Oliver on 06/28/19 at  3:40 PM EDT by a video enabled telemedicine application and verified that I am speaking with the correct person using two identifiers.  Location: Patient: home Provider: office   I discussed the limitations of evaluation and management by telemedicine and the availability of in person appointments. The patient expressed understanding and agreed to proceed.  History of Present Illness: See above.    Observations/Objective: Today's visit was via telephone Physical exam was not possible for this visit Alert, oriented. Cheerful affect. Thoughts logical, coherent and relevant.   Assessment and Plan: Problem List Items Addressed This Visit      Other   Anxiety with depression   Encounter for long-term opiate analgesic use   Mouth pain - Primary     Meds ordered this encounter  Medications  .  DISCONTD: HYDROcodone-acetaminophen (NORCO/VICODIN) 5-325 MG tablet    Sig: Take one tab po up to QID prn for pain. With the goal of only taking TID prn.    Dispense:  100 tablet    Refill:  0    Order Specific Question:   Supervising Provider    Answer:   Sallee Lange A [[id number]]  . DISCONTD: HYDROcodone-acetaminophen (NORCO/VICODIN) 5-325 MG tablet    Sig: Take one tab po up to QID prn for pain. With the goal of only taking TID prn.    Dispense:  100 tablet    Refill:  0    May fill 30 days from 06/28/19    Order Specific Question:   Supervising Provider    Answer:   Sallee Lange A [[id number]]  . HYDROcodone-acetaminophen (NORCO/VICODIN) 5-325 MG tablet    Sig: Take one tab po up to QID prn for pain. With the goal of only taking TID prn.    Dispense:  100 tablet    Refill:  0    May fill 60 days from 06/28/19    Order Specific Question:   Supervising Provider    Answer:   Sallee Lange A [[id number]]    Follow Up Instructions: Continue current pain medication regimen. Defers COVID vaccine. Wants to continue hold on labs due to concerns about COVID, Will discuss again at next visit. Advised patient about concerns about medications, especially Xanax with Ambien. Ambien discontinued. Patient understands and agrees with this plan.    I discussed the assessment and treatment plan with  the patient. The patient was provided an opportunity to ask questions and all were answered. The patient agreed with the plan and demonstrated an understanding of the instructions.   The patient was advised to call back or seek an in-person evaluation if the symptoms worsen or if the condition fails to improve as anticipated. Return in about 3 months (around 09/28/2019).  I provided 15 minutes of non-face-to-face time during this encounter.      Review of Systems     Objective:   Physical Exam        Assessment & Plan:

## 2019-06-29 ENCOUNTER — Encounter: Payer: Self-pay | Admitting: Nurse Practitioner

## 2019-07-28 ENCOUNTER — Other Ambulatory Visit: Payer: Self-pay | Admitting: Family Medicine

## 2019-07-29 NOTE — Telephone Encounter (Signed)
Last med checkup 06/28/19

## 2019-07-30 ENCOUNTER — Other Ambulatory Visit: Payer: Self-pay | Admitting: *Deleted

## 2019-07-30 DIAGNOSIS — E785 Hyperlipidemia, unspecified: Secondary | ICD-10-CM

## 2019-07-30 DIAGNOSIS — E039 Hypothyroidism, unspecified: Secondary | ICD-10-CM

## 2019-07-30 DIAGNOSIS — Z79899 Other long term (current) drug therapy: Secondary | ICD-10-CM

## 2019-08-21 ENCOUNTER — Other Ambulatory Visit: Payer: Self-pay | Admitting: Family Medicine

## 2019-11-08 ENCOUNTER — Ambulatory Visit (INDEPENDENT_AMBULATORY_CARE_PROVIDER_SITE_OTHER): Payer: Medicare Other | Admitting: Nurse Practitioner

## 2019-11-08 ENCOUNTER — Encounter: Payer: Self-pay | Admitting: Nurse Practitioner

## 2019-11-08 ENCOUNTER — Other Ambulatory Visit: Payer: Self-pay

## 2019-11-08 VITALS — BP 122/80 | HR 99 | Temp 97.1°F | Ht 64.0 in | Wt 131.0 lb

## 2019-11-08 DIAGNOSIS — K1379 Other lesions of oral mucosa: Secondary | ICD-10-CM

## 2019-11-08 DIAGNOSIS — R634 Abnormal weight loss: Secondary | ICD-10-CM

## 2019-11-08 DIAGNOSIS — Z79891 Long term (current) use of opiate analgesic: Secondary | ICD-10-CM | POA: Diagnosis not present

## 2019-11-08 MED ORDER — HYDROCODONE-ACETAMINOPHEN 5-325 MG PO TABS: ORAL_TABLET | ORAL | 0 refills | Status: DC

## 2019-11-08 MED ORDER — SERTRALINE HCL 50 MG PO TABS: 100.0000 mg | ORAL_TABLET | Freq: Every day | ORAL | 0 refills | Status: DC

## 2019-11-08 MED ORDER — ALPRAZOLAM 1 MG PO TABS: ORAL_TABLET | ORAL | 5 refills | Status: DC

## 2019-11-08 NOTE — Patient Instructions (Signed)
Zaditor eye drops (generic)

## 2019-11-08 NOTE — Progress Notes (Signed)
Subjective:    Patient ID: Cassandra Oliver, female    DOB: Jun 02, 1950, 69 y.o.   MRN: [id number]  HPI This patient was seen today for chronic pain  The medication list was reviewed and updated.    -Compliance with medication: Yes  - Number patient states they take daily: three, sometimes 4; scar tissue tightening.   -when was the last dose patient took? One today  The patient was advised the importance of maintaining medication and not using illegal substances with these.  Here for refills and follow up  The patient was educated that we can provide 3 monthly scripts for their medication, it is their responsibility to follow the instructions.  Side effects or complications from medications: none  Patient is aware that pain medications are meant to minimize the severity of the pain to allow their pain levels to improve to allow for better function. They are aware of that pain medications cannot totally remove their pain.  Due for UDT ( at least once per year) : due today.   Scale of 1 to 10 ( 1 is least 10 is most) Your pain level without the medicine: 12 - 15.  Your pain level with medication: 7 or 8   Scale 1 to 10 ( 1-helps very little, 10 helps very well) How well does your pain medication reduce your pain so you can function better through out the day? 7 or 8  Having increasing pain in the mouth and jaw from scar tissue from previous surgery and radiation.  Is able to perform ADLs. Main exercise is walking her 9 dogs.  Limited appetite. Drinks one Slimfast with 20 gm protein daily. Very limited on the foods that she eats due to her mouth issues. Many foods will cling to her tongue. Diet mainly soft foods, thickened soup or potatoes. No longer cooks since her husband is in assisted living.   Right eye tearing off and on. Tried saline drops and loratadine. Some itching.    Review of Systems     Objective:   Physical Exam NAD. Alert, oriented. Conjunctivae clear bilaterally.  Lungs clear. Heart RRR.  Dressed appropriately. Cheerful, calm affect.      Assessment & Plan:   Problem List Items Addressed This Visit      Other   Encounter for long-term opiate analgesic use - Primary   Relevant Orders   ToxASSURE Select 13 (MW), Urine   Mouth pain   Weight loss     Meds ordered this encounter  Medications  . DISCONTD: HYDROcodone-acetaminophen (NORCO/VICODIN) 5-325 MG tablet    Sig: Take one tab po up to QID prn for pain. With the goal of only taking TID prn.    Dispense:  100 tablet    Refill:  0    May fill 60 days from 11/08/19    Order Specific Question:   Supervising Provider    Answer:   Sallee Lange A [[id number]]  . DISCONTD: HYDROcodone-acetaminophen (NORCO/VICODIN) 5-325 MG tablet    Sig: Take one tab po up to QID prn for pain. With the goal of only taking TID prn.    Dispense:  100 tablet    Refill:  0    May fill 30 days from 11/08/19    Order Specific Question:   Supervising Provider    Answer:   Sallee Lange A [[id number]]  . HYDROcodone-acetaminophen (NORCO/VICODIN) 5-325 MG tablet    Sig: Take one tab po up to QID prn for pain. With the  goal of only taking TID prn.    Dispense:  100 tablet    Refill:  0    Order Specific Question:   Supervising Provider    Answer:   Sallee Lange A [[id number]]  . ALPRAZolam (XANAX) 1 MG tablet    Sig: Take one tab po BID prn anxiety    Dispense:  60 tablet    Refill:  5    May refill monthly    Order Specific Question:   Supervising Provider    Answer:   Sallee Lange A [[id number]]  . sertraline (ZOLOFT) 50 MG tablet    Sig: Take 2 tablets (100 mg total) by mouth daily.    Dispense:  180 tablet    Refill:  0    Order Specific Question:   Supervising Provider    Answer:   Sallee Lange A [[id number]]   Continue current medications as directed.  Has been off Ambien for several months due to risks while taking both opiates and Xanax.  Has great difficulty sleeping, usually only a few hours per night. Does not take naps  during the day.  Would like to restart Ambien at the 10 mg dose. Was not able to take Trazodone. Stated it made her feel "like a zombie". Will consult with Dr. Nicki Reaper regarding options.  Encouraged patient to try to maintain her current weight by increasing her caloric and protein intake. Discussed options. Wishes to continue Slimfast for now.  Return in about 3 months (around 02/07/2020) for Pain management.

## 2019-11-09 ENCOUNTER — Encounter: Payer: Self-pay | Admitting: Nurse Practitioner

## 2019-11-09 DIAGNOSIS — R634 Abnormal weight loss: Secondary | ICD-10-CM | POA: Insufficient documentation

## 2019-11-09 DIAGNOSIS — R6889 Other general symptoms and signs: Secondary | ICD-10-CM | POA: Insufficient documentation

## 2019-11-12 LAB — TOXASSURE SELECT 13 (MW), URINE

## 2019-11-15 ENCOUNTER — Telehealth: Payer: Self-pay | Admitting: Family Medicine

## 2019-11-15 NOTE — Telephone Encounter (Signed)
Pt is calling to see if Hoyle Sauer has talked with Dr. Nicki Reaper about her restarting her zolpidem   She saw Hoyle Sauer 9/17rf

## 2019-11-20 ENCOUNTER — Other Ambulatory Visit: Payer: Self-pay | Admitting: Nurse Practitioner

## 2019-11-22 NOTE — Telephone Encounter (Signed)
Cassandra Oliver I reviewed over her record.  I sympathize with her situation but I do not recommend any controlled medication for sleep.  Patient is already on 2 controlled medications. (I realize you know this and had to run this by me due to patient's concerns) There is no perfect solution for this patient She will have to grasp that a controlled medicine for sleep is just not going to happen with her being on these other medicines. There are other psychiatric medicines that could be tried to help induce sleep but to be clear none of these are officially "a sleep pill " I do recommend that a sleep hygiene packet be given to the patient I also recommend that she consider cognitive behavioral therapy to address sleep I will consider other medications but once again to be clear none of these other medicines that we look into will be controlled sleeping pills This is purely for safety for the patient.  She may not understand this but once again we have to stick with what is safe. Feel free to have the nurses inform the patient of this but may need to stress that these recommendations are based upon what is safest.

## 2019-11-22 NOTE — Telephone Encounter (Signed)
Please see Dr. Bary Leriche message regarding Ambien use and let the patient know. She and I can discuss other medications or options. It is just too unsafe to combine these with her current medications. Thanks.

## 2019-11-22 NOTE — Telephone Encounter (Signed)
I sent a message to Dr. Nicki Reaper after her visit to get his input. Could you check to see if this is in his box or send it to him again? Thanks.

## 2019-11-22 NOTE — Telephone Encounter (Signed)
Thank you. I feel the same way but  thought it might help also coming from you.

## 2019-11-22 NOTE — Telephone Encounter (Signed)
Discussed with patient and patient stated she is very disappointed in this decision and declined coming in for other options because nothing else helps- she is just very upset and disappointed that no one cares if she cant sleep- she has taken the meds for years and functioned fine. Patient understands we have to follow the recommendations but is just upset with decision

## 2019-11-22 NOTE — Telephone Encounter (Signed)
Patient called back crying since she hasn't heard about her message from last week. Patient states she has not been sleeping at all and it has been going on for 2 weeks now and she needs some help -cries all the time because she is not sleeping and she has to be able to sleep

## 2020-01-31 ENCOUNTER — Encounter: Payer: Self-pay | Admitting: Nurse Practitioner

## 2020-01-31 ENCOUNTER — Ambulatory Visit (INDEPENDENT_AMBULATORY_CARE_PROVIDER_SITE_OTHER): Payer: Medicare Other | Admitting: Nurse Practitioner

## 2020-01-31 ENCOUNTER — Other Ambulatory Visit: Payer: Self-pay

## 2020-01-31 VITALS — BP 134/74 | HR 106 | Temp 97.7°F | Wt 133.8 lb

## 2020-01-31 DIAGNOSIS — K1379 Other lesions of oral mucosa: Secondary | ICD-10-CM | POA: Diagnosis not present

## 2020-01-31 DIAGNOSIS — Z79891 Long term (current) use of opiate analgesic: Secondary | ICD-10-CM

## 2020-01-31 DIAGNOSIS — F418 Other specified anxiety disorders: Secondary | ICD-10-CM | POA: Diagnosis not present

## 2020-01-31 MED ORDER — HYDROCODONE-ACETAMINOPHEN 5-325 MG PO TABS: ORAL_TABLET | ORAL | 0 refills | Status: DC

## 2020-01-31 MED ORDER — ALPRAZOLAM 1 MG PO TABS: ORAL_TABLET | ORAL | 5 refills | Status: DC

## 2020-01-31 NOTE — Progress Notes (Signed)
Subjective:    Patient ID: Cassandra Oliver, female    DOB: 1950-03-28, 69 y.o.   MRN: [id number]  Patient here for med check.Patient with chronic pain in the mouth and jaw previous surgery and radiation. Pain does not interfere in ability to perform ADLs. Patient reports limited appetite related to surgical interventions of mouth and tongue. Drinks protein and eats yogurt to obtain adequate protein intake. Her diet is very limited due to her mouth issues. Diet mainly soft foods, thickened soup or potatoes. Patient reports increased use of hydrocodone since stopping Ambien. She states she awakens at night now and has to take an extra dose due to pain. She also has tried aspirin and reports some relief from that.  Patient feels anxiety and depression are stable. She recently visited with sons from [place] and reports elevated mood since. She still reports grief and is tearful when discussing placement of her husband in a dementia care unit. She reports gradual weight loss and feels this was related to grief. Patient states she is taking her Xanax and Prozac as prescribed.   This patient was seen today for chronic pain  The medication list was reviewed and updated.   -Compliance with medication: Hydrocodone 5-325 mg yes  - Number patient states they take daily: 3; is allowed to take 4 but does not get enough tablets to allow for 4/day   -when was the last dose patient took? 2 pain pills today; last one around noon  The patient was advised the importance of maintaining medication and not using illegal substances with these.  Here for refills and follow up  The patient was educated that we can provide 3 monthly scripts for their medication, it is their responsibility to follow the instructions.  Side effects or complications from medications: none  Patient is aware that pain medications are meant to minimize the severity of the pain to allow their pain levels to improve to allow for better function. They  are aware of that pain medications cannot totally remove their pain.  Due for UDT ( at least once per year) : completed 11/08/19  Scale of 1 to 10 ( 1 is least 10 is most) Your pain level without the medicine: 10 Your pain level with medication: 6 (but does not last but about one hour)   Scale 1 to 10 ( 1-helps very little, 10 helps very well) How well does your pain medication reduce your pain so you can function better through out the day?  4  Pt states difficulty staying asleep at night since stopping Ambien.  Is able to perform essential ADLs. Her sons help her with yard work when they visit. Has several dogs which provide her emotional support.  Does not take the Xanax and pain medication at the same time.     Review of Systems  Constitutional: Positive for unexpected weight change. Negative for activity change, appetite change and fatigue.  HENT:       Mouth and throat pain  Respiratory: Negative for chest tightness, shortness of breath and wheezing.   Cardiovascular: Negative for chest pain and palpitations.  Gastrointestinal: Negative for abdominal pain, diarrhea, nausea and vomiting.  Neurological: Negative for dizziness, syncope, light-headedness and headaches.  Psychiatric/Behavioral: Positive for sleep disturbance. Negative for suicidal ideas. The patient is not nervous/anxious.        Objective:   Physical Exam Vitals reviewed.  Constitutional:      General: She is not in acute distress.    Appearance:  She is not ill-appearing.  Cardiovascular:     Rate and Rhythm: Normal rate and regular rhythm.     Heart sounds: Normal heart sounds. No murmur heard. No gallop.   Pulmonary:     Effort: Pulmonary effort is normal. No respiratory distress.     Breath sounds: Normal breath sounds. No wheezing or rhonchi.  Lymphadenopathy:     Cervical: No cervical adenopathy.  Neurological:     Mental Status: She is alert.  Psychiatric:        Attention and Perception:  Attention normal.        Mood and Affect: Affect is tearful.        Speech: Speech normal.        Behavior: Behavior normal. Behavior is cooperative.        Thought Content: Thought content normal. Thought content does not include homicidal or suicidal ideation. Thought content does not include homicidal or suicidal plan.        Judgment: Judgment is not impulsive.    Today's Vitals   01/31/20 1424  BP: 134/74  Pulse: (!) 106  Temp: 97.7 F (36.5 C)  SpO2: 96%  Weight: 133 lb 12.8 oz (60.7 kg)   Body mass index is 22.97 kg/m. Weight has stabilized; has gained 3 lbs since last visit.  [facility] Visit from 01/04/2018 in [facility]  PHQ-9 Total Score 6           Assessment & Plan:   Problem List Items Addressed This Visit      Other   Anxiety with depression   Relevant Medications   ALPRAZolam (XANAX) 1 MG tablet   Encounter for long-term opiate analgesic use - Primary   Mouth pain     Meds ordered this encounter  Medications  . ALPRAZolam (XANAX) 1 MG tablet    Sig: Take one tab po BID prn anxiety    Dispense:  60 tablet    Refill:  5    May refill monthly    Order Specific Question:   Supervising Provider    Answer:   Sallee Lange A [[id number]]  . DISCONTD: HYDROcodone-acetaminophen (NORCO/VICODIN) 5-325 MG tablet    Sig: Take one tab po up to QID prn for pain. With the goal of only taking TID prn.    Dispense:  110 tablet    Refill:  0    May fill on 02/06/2020    Order Specific Question:   Supervising Provider    Answer:   Sallee Lange A [[id number]]  . DISCONTD: HYDROcodone-acetaminophen (NORCO/VICODIN) 5-325 MG tablet    Sig: Take one tab po up to QID prn for pain. With the goal of only taking TID prn.    Dispense:  110 tablet    Refill:  0    May fill 30 days from 02/06/2020    Order Specific Question:   Supervising Provider    Answer:   Sallee Lange A [[id number]]  . HYDROcodone-acetaminophen (NORCO/VICODIN) 5-325 MG tablet    Sig:  Take one tab po up to QID prn for pain. With the goal of only taking TID prn.    Dispense:  110 tablet    Refill:  0    May fill 60 days from 02/06/2020    Order Specific Question:   Supervising Provider    Answer:   Sallee Lange A [[id number]]   Continue Xanax regimen as prescribed May increase Hydrocodone to 4x per day as needed for pain; will increase Rx  by 10 more pills per month and reevaluate at next visit. Educated patient on benefits of grief counseling.  Encouraged therapy participation in group meetings or 1:1 sessions.  Encouraged and educated patient on benefits of self care and stress reduction. Patient declines all health screenings and vaccinations at this time.  Return in about 3 months (around 04/30/2020) for med follow-up.

## 2020-01-31 NOTE — Patient Instructions (Addendum)
Continue Xanax regimen May increase Hydrocodone to 4x per day as needed Consider grief counseling

## 2020-02-03 ENCOUNTER — Encounter: Payer: Self-pay | Admitting: Nurse Practitioner

## 2020-05-12 ENCOUNTER — Other Ambulatory Visit: Payer: Self-pay | Admitting: Nurse Practitioner

## 2020-05-22 ENCOUNTER — Other Ambulatory Visit: Payer: Self-pay

## 2020-05-22 ENCOUNTER — Encounter: Payer: Self-pay | Admitting: Nurse Practitioner

## 2020-05-22 ENCOUNTER — Ambulatory Visit (INDEPENDENT_AMBULATORY_CARE_PROVIDER_SITE_OTHER): Payer: Medicare Other | Admitting: Nurse Practitioner

## 2020-05-22 VITALS — BP 130/80 | Temp 97.9°F | Ht 64.0 in | Wt 134.0 lb

## 2020-05-22 DIAGNOSIS — K1379 Other lesions of oral mucosa: Secondary | ICD-10-CM | POA: Diagnosis not present

## 2020-05-22 DIAGNOSIS — G4700 Insomnia, unspecified: Secondary | ICD-10-CM

## 2020-05-22 DIAGNOSIS — R030 Elevated blood-pressure reading, without diagnosis of hypertension: Secondary | ICD-10-CM | POA: Diagnosis not present

## 2020-05-22 DIAGNOSIS — Z79891 Long term (current) use of opiate analgesic: Secondary | ICD-10-CM | POA: Diagnosis not present

## 2020-05-22 DIAGNOSIS — F418 Other specified anxiety disorders: Secondary | ICD-10-CM | POA: Diagnosis not present

## 2020-05-22 DIAGNOSIS — E039 Hypothyroidism, unspecified: Secondary | ICD-10-CM | POA: Diagnosis not present

## 2020-05-22 MED ORDER — HYDROCODONE-ACETAMINOPHEN 5-325 MG PO TABS: ORAL_TABLET | ORAL | 0 refills | Status: DC

## 2020-05-22 MED ORDER — ALPRAZOLAM 1 MG PO TABS: ORAL_TABLET | ORAL | 5 refills | Status: DC

## 2020-05-22 NOTE — Progress Notes (Signed)
Subjective:    Patient ID: Cassandra Oliver, female    DOB: [birth year], 70 y.o.   MRN: [id number]  HPI  Patient visits for a 3 month follow up . Reports trouble sleeping at night and requesting refill on zolpidem and hydrocodone .  This patient was seen today for chronic pain  The medication list was reviewed and updated.  Location of Pain for which the patient has been treated with regarding narcotics: mouth and jaw  Onset of this pain: chronic   -Compliance with medication: yes  - Number patient states they take daily: 3-4 times per day depending on her mouth pain  -when was the last dose patient took? This am  The patient was advised the importance of maintaining medication and not using illegal substances with these.  Here for refills and follow up  The patient was educated that we can provide 3 monthly scripts for their medication, it is their responsibility to follow the instructions.  Side effects or complications from medications: none  Patient is aware that pain medications are meant to minimize the severity of the pain to allow their pain levels to improve to allow for better function. They are aware of that pain medications cannot totally remove their pain.  Due for UDT ( at least once per year) : Sept 2021   Pain medication allows patient to live independently and perform ADLs.  Has been trying to get more active getting up and down more at home.  Has started a quilting class a couple of days a week which is providing some socialization.  Drinking 1 protein shake per day.  BP outside the office is good running about 125/72.  Oxygen saturation levels 97 to 99%.  Patient denies chest pain/ischemic type pain or SOB. Denies visual changes, difficulty speaking or swallowing or numbness or weakness of the face, arms or legs.  Requesting to restart her Ambien 5 mg for sleep. Doing well on Xanax and Zoloft.    Review of Systems     Objective:   Physical Exam NAD.  Alert,  oriented.  Cheerful mildly anxious affect.  Making good eye contact.  Dressed appropriately.  Lungs clear.  Heart regular rate rhythm.  Weight stable. Today's Vitals   05/22/20 1032  BP: 130/80  Temp: 97.9 F (36.6 C)  SpO2: 99%  Weight: 134 lb (60.8 kg)  Height: 5\' 4"  (1.626 m)   Body mass index is 23 kg/m.      Assessment & Plan:   Problem List Items Addressed This Visit      Endocrine   Hypothyroidism     Other   Anxiety with depression   Relevant Medications   ALPRAZolam (XANAX) 1 MG tablet   Elevated BP without diagnosis of hypertension   Encounter for long-term opiate analgesic use - Primary   Insomnia   Mouth pain      Meds ordered this encounter  Medications  . ALPRAZolam (XANAX) 1 MG tablet    Sig: Take one tab po BID prn anxiety    Dispense:  60 tablet    Refill:  5    May refill monthly; may fill 30 days from 05/12/20    Order Specific Question:   Supervising Provider    Answer:   Sallee Lange A [[id number]]  . DISCONTD: HYDROcodone-acetaminophen (NORCO/VICODIN) 5-325 MG tablet    Sig: Take one tab po up to QID prn for pain. With the goal of only taking TID prn.    Dispense:  110  tablet    Refill:  0    May fill 60 days from 05/22/20    Order Specific Question:   Supervising Provider    Answer:   Sallee Lange A [[id number]]  . DISCONTD: HYDROcodone-acetaminophen (NORCO/VICODIN) 5-325 MG tablet    Sig: Take one tab po up to QID prn for pain. With the goal of only taking TID prn.    Dispense:  110 tablet    Refill:  0    May fill 30 days from 05/22/20    Order Specific Question:   Supervising Provider    Answer:   Sallee Lange A [[id number]]  . HYDROcodone-acetaminophen (NORCO/VICODIN) 5-325 MG tablet    Sig: Take one tab po up to QID prn for pain. With the goal of only taking TID prn.    Dispense:  110 tablet    Refill:  0    Order Specific Question:   Supervising Provider    Answer:   Sallee Lange A [[id number]]   Continue current medications as directed. Will  consult with Dr. Nicki Reaper about Ambien. 30 minutes was spent with the patient including previsit chart review, time spent with patient, discussion of health issues, review of data including medical record, and documentation of the visit.

## 2020-08-09 ENCOUNTER — Other Ambulatory Visit: Payer: Self-pay | Admitting: Family Medicine

## 2020-08-10 ENCOUNTER — Other Ambulatory Visit: Payer: Self-pay | Admitting: Nurse Practitioner

## 2020-08-27 ENCOUNTER — Other Ambulatory Visit: Payer: Self-pay | Admitting: Family Medicine

## 2020-08-27 ENCOUNTER — Telehealth: Payer: Self-pay | Admitting: Family Medicine

## 2020-08-27 MED ORDER — HYDROCODONE-ACETAMINOPHEN 5-325 MG PO TABS: ORAL_TABLET | ORAL | 0 refills | Status: DC

## 2020-08-27 NOTE — Telephone Encounter (Signed)
Discussed with pt. Pt verbalized understanding.  °

## 2020-08-27 NOTE — Telephone Encounter (Signed)
2-week supply was sent and keep follow-up office visit

## 2020-08-27 NOTE — Telephone Encounter (Signed)
Patient is requesting enough pain medication until appointment on 7/15 hydrocodone 5/325 called into [facility]

## 2020-08-27 NOTE — Telephone Encounter (Signed)
Last seen 4/1 for pain management and has upcoming appt 7/15

## 2020-09-04 ENCOUNTER — Ambulatory Visit: Payer: Medicare Other | Admitting: Nurse Practitioner

## 2020-09-04 ENCOUNTER — Encounter: Payer: Self-pay | Admitting: Nurse Practitioner

## 2020-09-10 ENCOUNTER — Other Ambulatory Visit: Payer: Self-pay

## 2020-09-10 ENCOUNTER — Ambulatory Visit (INDEPENDENT_AMBULATORY_CARE_PROVIDER_SITE_OTHER): Payer: Medicare Other | Admitting: Nurse Practitioner

## 2020-09-10 VITALS — BP 118/75 | HR 79 | Temp 97.1°F | Ht 64.0 in | Wt 131.0 lb

## 2020-09-10 DIAGNOSIS — Z79891 Long term (current) use of opiate analgesic: Secondary | ICD-10-CM

## 2020-09-10 DIAGNOSIS — K1379 Other lesions of oral mucosa: Secondary | ICD-10-CM

## 2020-09-10 DIAGNOSIS — F418 Other specified anxiety disorders: Secondary | ICD-10-CM

## 2020-09-10 MED ORDER — LEVOTHYROXINE SODIUM 112 MCG PO TABS: 112.0000 ug | ORAL_TABLET | Freq: Every day | ORAL | 1 refills | Status: DC

## 2020-09-10 MED ORDER — METOPROLOL SUCCINATE ER 50 MG PO TB24: 50.0000 mg | ORAL_TABLET | Freq: Every day | ORAL | 1 refills | Status: DC

## 2020-09-10 MED ORDER — HYDROCODONE-ACETAMINOPHEN 5-325 MG PO TABS: ORAL_TABLET | ORAL | 0 refills | Status: DC

## 2020-09-10 MED ORDER — ALPRAZOLAM 1 MG PO TABS: ORAL_TABLET | ORAL | 5 refills | Status: DC

## 2020-09-10 NOTE — Progress Notes (Signed)
Subjective:    Patient ID: Cassandra Oliver, female    DOB: May 06, 1950, 70 y.o.   MRN: [id number]  HPI  This patient was seen today for chronic pain  The medication list was reviewed and updated.  Location of Pain for which the patient has been treated with regarding narcotics: mouth pain due to hx of tongue cancer including surgery and radiation  Onset of this pain: 9 yrs ago   -Compliance with medication: daily  - Number patient states they take daily: 4 daily  -when was the last dose patient took? This morning  The patient was advised the importance of maintaining medication and not using illegal substances with these.  Here for refills and follow up  The patient was educated that we can provide 3 monthly scripts for their medication, it is their responsibility to follow the instructions.  Side effects or complications from medications: none  Patient is aware that pain medications are meant to minimize the severity of the pain to allow their pain levels to improve to allow for better function. They are aware of that pain medications cannot totally remove their pain.  Due for UDT ( at least once per year) : last 11/08/2019  Scale of 1 to 10 ( 1 is least 10 is most) Your pain level without the medicine: 8 to 10 Your pain level with medication 5 to 6 for 1 hr  Scale 1 to 10 ( 1-helps very little, 10 helps very well) How well does your pain medication reduce your pain so you can function better through out the day? Tolerable for one hr then starts back going up   Quality of the pain: severe burning  Persistence of the pain: always  Modifying factors: none BP was 175/90 initially today.  Running very well outside the office her last reading was 118/75.  Averages about 120/80.  Getting some activity walking her dogs.  Has some family support although this is out of town.  Has a close relationship with some friends and neighbors.  Continues to have issues with her appetite and  problems with eating due to burning and chronic pain from mouth surgery and radiation.  Does drink a Slim fast with protein daily. Adherent to medication regimen.  Does not take the Xanax at the same time as her hydrocodone. Depression screen PHQ 2/9 01/31/2020  Decreased Interest 1  Down, Depressed, Hopeless 1  PHQ - 2 Score 2  Altered sleeping 3  Tired, decreased energy 1  Change in appetite 3  Feeling bad or failure about yourself  2  Trouble concentrating 2  Moving slowly or fidgety/restless 0  Suicidal thoughts 0  PHQ-9 Score 13  Difficult doing work/chores Very difficult         Objective:   Physical Exam NAD. Alert, oriented. Mildly anxious affect. Tearful at times. Making good eye contact. Dressed appropriately.  Speech clear.  Thoughts logical coherent and relevant. Today's Vitals   09/10/20 1057  BP: 118/75  Pulse: 79  Temp: (!) 97.1 F (36.2 C)  SpO2: 98%  Weight: 131 lb (59.4 kg)  Height: 5\' 4"  (1.626 m)   Body mass index is 22.49 kg/m. Weight stable from previous visit.       Assessment & Plan:   Problem List Items Addressed This Visit       Other   Anxiety with depression   Relevant Medications   ALPRAZolam (XANAX) 1 MG tablet   Encounter for long-term opiate analgesic use - Primary  Mouth pain   Meds ordered this encounter  Medications   ALPRAZolam (XANAX) 1 MG tablet    Sig: Take one tab po BID prn anxiety    Dispense:  60 tablet    Refill:  5    May fill monthly   DISCONTD: HYDROcodone-acetaminophen (NORCO/VICODIN) 5-325 MG tablet    Sig: Take one tab po up to QID prn for pain. With the goal of only taking TID prn.    Dispense:  110 tablet    Refill:  0    May fill today   levothyroxine (SYNTHROID) 112 MCG tablet    Sig: Take 1 tablet (112 mcg total) by mouth daily.    Dispense:  90 tablet    Refill:  1   metoprolol succinate (TOPROL-XL) 50 MG 24 hr tablet    Sig: Take 1 tablet (50 mg total) by mouth daily. TAKE WITH OR IMMEDIATELY  FOLLOWING A MEAL.    Dispense:  90 tablet    Refill:  1   DISCONTD: HYDROcodone-acetaminophen (NORCO/VICODIN) 5-325 MG tablet    Sig: Take one tab po up to QID prn for pain. With the goal of only taking TID prn.    Dispense:  110 tablet    Refill:  0    May fill 30 days from 09/10/20    Order Specific Question:   Supervising Provider    Answer:   Sallee Lange A [[id number]]   HYDROcodone-acetaminophen (NORCO/VICODIN) 5-325 MG tablet    Sig: Take one tab po up to QID prn for pain. With the goal of only taking TID prn.    Dispense:  110 tablet    Refill:  0    May fill 60 days from 09/10/20    Order Specific Question:   Supervising Provider    Answer:   Sallee Lange A [[id number]]   Discussed importance of regular activity and adequate support system.  Patient denies suicidal thoughts or ideation.  Does defer any preventive health measures, states "if it is her time to go and that is fine". Continue medications at current dose.  After the visit it was noted that she has decreased her sertraline to 50 mg daily per nurse note, will have nurses call and recommend she increase that back up to 2 tablets/day as previously prescribed if tolerated. Return in about 3 months (around 12/11/2020) for pain management. Call back sooner if needed.

## 2020-09-11 ENCOUNTER — Encounter: Payer: Self-pay | Admitting: Nurse Practitioner

## 2020-10-24 ENCOUNTER — Telehealth: Payer: Self-pay

## 2020-10-24 NOTE — Telephone Encounter (Signed)
Left detailed message on patient's mobile vm to call the [place] office so that she can be scheduled for her AWV. I have provided pt with the office contact information.

## 2020-11-07 ENCOUNTER — Other Ambulatory Visit: Payer: Self-pay | Admitting: Family Medicine

## 2020-12-11 ENCOUNTER — Encounter: Payer: Self-pay | Admitting: Nurse Practitioner

## 2020-12-11 ENCOUNTER — Ambulatory Visit (INDEPENDENT_AMBULATORY_CARE_PROVIDER_SITE_OTHER): Payer: Medicare Other | Admitting: Nurse Practitioner

## 2020-12-11 ENCOUNTER — Other Ambulatory Visit: Payer: Self-pay

## 2020-12-11 VITALS — BP 159/96 | HR 84 | Ht 64.0 in | Wt 124.6 lb

## 2020-12-11 DIAGNOSIS — F418 Other specified anxiety disorders: Secondary | ICD-10-CM | POA: Diagnosis not present

## 2020-12-11 DIAGNOSIS — K1379 Other lesions of oral mucosa: Secondary | ICD-10-CM | POA: Diagnosis not present

## 2020-12-11 DIAGNOSIS — R030 Elevated blood-pressure reading, without diagnosis of hypertension: Secondary | ICD-10-CM

## 2020-12-11 DIAGNOSIS — Z79891 Long term (current) use of opiate analgesic: Secondary | ICD-10-CM | POA: Diagnosis not present

## 2020-12-11 DIAGNOSIS — E039 Hypothyroidism, unspecified: Secondary | ICD-10-CM | POA: Diagnosis not present

## 2020-12-11 DIAGNOSIS — G4700 Insomnia, unspecified: Secondary | ICD-10-CM

## 2020-12-11 DIAGNOSIS — R634 Abnormal weight loss: Secondary | ICD-10-CM | POA: Diagnosis not present

## 2020-12-11 MED ORDER — HYDROCODONE-ACETAMINOPHEN 5-325 MG PO TABS: ORAL_TABLET | ORAL | 0 refills | Status: DC

## 2020-12-11 NOTE — Progress Notes (Signed)
Subjective:    Patient ID: Cassandra Oliver, female    DOB: 1950/09/29, 70 y.o.   MRN: [id number]  HPI  This patient was seen today for chronic pain  The medication list was reviewed and updated.   -Compliance with medication: yes  - Number patient states they take daily: 3-4 a day  -when was the last dose patient took? today  The patient was advised the importance of maintaining medication and not using illegal substances with these.  Here for refills and follow up  The patient was educated that we can provide 3 monthly scripts for their medication, it is their responsibility to follow the instructions.  Side effects or complications from medications: none  Patient is aware that pain medications are meant to minimize the severity of the pain to allow their pain levels to improve to allow for better function. They are aware of that pain medications cannot totally remove their pain.   Patient would like to discuss sleep- been using xanax but would like to increase to 3 a day from 2 a day Anxiety is worse and sleeping issues She wants to know about having her wishes on file- living will Patient continues to have significant mouth pain due to previous oncology issues including surgery and radiation.  States she is having dental issues as well.  Decreased appetite.  Taking a protein shake supplement in the mornings.  Otherwise limited intake.  When asked about her social support, patient states she is doing quilting through [place] and that she lives for her dogs, currently has 9 dogs.  Has 2 sons living in [place] but at this point has contact with them either of them.  Her husband is living in an assisted living facility because of early dementia which is very expensive.  Most of their savings and retirement will go to pay for this and then he will probably be living in a long-term care facility starting in April.  Patient states she has difficulty going to see him because he starts  asking to go home.   Patient becomes upset and crying frequently during the visit discussing her care.  Denies suicidal thoughts or ideation but does ask why she is even here.  States she mainly is living because of her pets. Pain medication allows her to have functionality and perform activities of daily living including taking care of herself and her pets.  Remains pretty isolated, avoids going out in public, part of the reason is that she has deferred her COVID vaccines. Continues to use Xanax especially at night since she is no longer on Ambien.  Would like to increase the amount she is getting per day.  Is taking her thyroid medicine but has not gotten her labs done over the past 2 years that were ordered.  Very adamant about not doing any labs.  Also defers all preventive health measures at this time. Denies chest pain/ischemic type pain or unusual shortness of breath.         Objective:   Physical Exam NAD.  Alert, oriented.  Lungs clear.  Heart regular rate rhythm.  Today's Vitals   12/11/20 1318  BP: (!) 159/96  Pulse: 84  Weight: 124 lb 9.6 oz (56.5 kg)  Height: 5\' 4"  (1.626 m)   Body mass index is 21.39 kg/m. Patient has lost 10 pounds since April.       Assessment & Plan:   Problem List Items Addressed This Visit  Endocrine   Hypothyroidism - Primary   Relevant Orders   TSH (Completed)     Other   Anxiety with depression   Elevated BP without diagnosis of hypertension   Encounter for long-term opiate analgesic use   Insomnia   Mouth pain   Weight loss   Meds ordered this encounter  Medications   DISCONTD: HYDROcodone-acetaminophen (NORCO/VICODIN) 5-325 MG tablet    Sig: Take one tab po up to QID prn for pain. With the goal of only taking TID prn.    Dispense:  110 tablet    Refill:  0    May fill 60 days from 12/11/20    Order Specific Question:   Supervising Provider    Answer:   Sallee Lange A [[id number]]   DISCONTD: HYDROcodone-acetaminophen  (NORCO/VICODIN) 5-325 MG tablet    Sig: Take one tab po up to QID prn for pain. With the goal of only taking TID prn.    Dispense:  110 tablet    Refill:  0    May fill 30 days from 12/11/20    Order Specific Question:   Supervising Provider    Answer:   Sallee Lange A [[id number]]   HYDROcodone-acetaminophen (NORCO/VICODIN) 5-325 MG tablet    Sig: Take one tab po up to QID prn for pain. With the goal of only taking TID prn.    Dispense:  110 tablet    Refill:  0    Order Specific Question:   Supervising Provider    Answer:   Sallee Lange A [[id number]]   Explained to patient that at this time due to her age and the amount of medications that we cannot increase her pain medication or her Xanax.  Lengthy discussion regarding possible referral to psychiatry or counseling.  Another option is a referral to pain management in [place].  Patient adamantly defers both options.  Strongly recommend her regular lab work, patient does agree to get TSH done today.  Explained to patient that we can no longer refill her levothyroxine without some lab work especially considering her weight loss.  Patient given over a month supply of Ensure supplements, recommend that she take 1 of those a day in addition to her morning protein shake. Return in about 3 months (around 03/13/2021) for  Dr. Lacinda Axon next visit. Reviewed the case with Dr. Lacinda Axon to make him aware of issues discussed today. Over 30 minutes was spent with this patient including the visit and documentation.

## 2020-12-12 ENCOUNTER — Other Ambulatory Visit: Payer: Self-pay | Admitting: Nurse Practitioner

## 2020-12-12 LAB — TSH: TSH: 0.021 u[IU]/mL — ABNORMAL LOW (ref 0.450–4.500)

## 2020-12-12 MED ORDER — LEVOTHYROXINE SODIUM 100 MCG PO TABS: 100.0000 ug | ORAL_TABLET | Freq: Every day | ORAL | 0 refills | Status: DC

## 2021-01-10 ENCOUNTER — Other Ambulatory Visit: Payer: Self-pay | Admitting: Nurse Practitioner

## 2021-03-16 ENCOUNTER — Ambulatory Visit (INDEPENDENT_AMBULATORY_CARE_PROVIDER_SITE_OTHER): Payer: Medicare Other | Admitting: Family Medicine

## 2021-03-16 ENCOUNTER — Other Ambulatory Visit: Payer: Self-pay

## 2021-03-16 VITALS — BP 142/80 | HR 71 | Temp 97.9°F | Wt 124.6 lb

## 2021-03-16 DIAGNOSIS — R7989 Other specified abnormal findings of blood chemistry: Secondary | ICD-10-CM

## 2021-03-16 DIAGNOSIS — Z85810 Personal history of malignant neoplasm of tongue: Secondary | ICD-10-CM | POA: Insufficient documentation

## 2021-03-16 DIAGNOSIS — E039 Hypothyroidism, unspecified: Secondary | ICD-10-CM

## 2021-03-16 DIAGNOSIS — Z79899 Other long term (current) drug therapy: Secondary | ICD-10-CM

## 2021-03-16 DIAGNOSIS — F418 Other specified anxiety disorders: Secondary | ICD-10-CM | POA: Diagnosis not present

## 2021-03-16 DIAGNOSIS — G8929 Other chronic pain: Secondary | ICD-10-CM | POA: Diagnosis not present

## 2021-03-16 MED ORDER — HYDROCODONE-ACETAMINOPHEN 5-325 MG PO TABS: 1.0000 | ORAL_TABLET | Freq: Four times a day (QID) | ORAL | 0 refills | Status: DC | PRN

## 2021-03-16 MED ORDER — METOPROLOL SUCCINATE ER 50 MG PO TB24: 50.0000 mg | ORAL_TABLET | Freq: Every day | ORAL | 3 refills | Status: DC

## 2021-03-16 MED ORDER — ALPRAZOLAM 1 MG PO TABS: ORAL_TABLET | ORAL | 5 refills | Status: DC

## 2021-03-16 MED ORDER — SERTRALINE HCL 50 MG PO TABS: 50.0000 mg | ORAL_TABLET | Freq: Every day | ORAL | 3 refills | Status: DC

## 2021-03-16 NOTE — Progress Notes (Signed)
Subjective:  Patient ID: Cassandra Oliver, female    DOB: 1950/11/14  Age: 71 y.o. MRN: [id number]  CC: Chief Complaint  Patient presents with   Pain Management    Pt states she believes the lower dose of thyroid med may help because she sometimes now feels hunger whereas in the past she could not.     HPI:  71 year old female with hypothyroidism, anxiety and depression, insomnia, chronic oral pain secondary to prior squamous of carcinoma of the tongue and subsequent radiation treatment presents for follow-up.  Patient is followed closely by Pearson Forster, NP.   Patient has longstanding history of chronic pain.  Treated with hydrocodone 5/325 mg, up to 4 tablets a day.  She is compliant with her medication.  She states that the medication helps her function.  She still has quite significant pain despite the medication.  She is in need of refill today.  Patient's TSH was recently suppressed and her dose of Synthroid was decreased.  Patient needs repeat labs today to ensure that she is within acceptable range.  Additionally, she has not had a metabolic panel since 9741.  Her creatinine was elevated at that time.  I advised her that she needs a metabolic panel to assess her kidney and liver function particularly in the setting of high risk medication.  She is amenable to this today.  Patient states that since her last visit she is doing better.  She went to a quilting retreat which helped dramatically.  She states that she met a lot of women who are very supportive and she is keeping in touch.  Her appetite has increased as well.  Overall she feels like she is improving from a mental standpoint.  Patient Active Problem List   Diagnosis Date Noted   History of tongue cancer 03/16/2021   Chronic pain 03/16/2021   Hypothyroidism 11/30/2018   Elevated BP without diagnosis of hypertension 01/11/2017   Encounter for long-term opiate analgesic use 07/10/2016   Anxiety with depression 07/10/2016    Insomnia 07/10/2016    Social Hx   Social History   Socioeconomic History   Marital status: Married    Spouse name: Not on file   Number of children: Not on file   Years of education: Not on file   Highest education level: Not on file  Occupational History   Not on file  Tobacco Use   Smoking status: Never   Smokeless tobacco: Never  Substance and Sexual Activity   Alcohol use: No   Drug use: No   Sexual activity: Never    Birth control/protection: Post-menopausal  Other Topics Concern   Not on file  Social History Narrative   Not on file   Social Determinants of Health   Financial Resource Strain: Not on file  Food Insecurity: Not on file  Transportation Needs: Not on file  Physical Activity: Not on file  Stress: Not on file  Social Connections: Not on file    Review of Systems Per HPI  Objective:  BP (!) 142/80    Pulse 71    Temp 97.9 F (36.6 C)    Wt 124 lb 9.6 oz (56.5 kg)    SpO2 99%    BMI 21.39 kg/m   BP/Weight 03/16/2021 12/11/2020 6/38/4536  Systolic BP 468 032 122  Diastolic BP 80 96 75  Wt. (Lbs) 124.6 124.6 131  BMI 21.39 21.39 22.49    Physical Exam Vitals and nursing note reviewed.  Constitutional:  General: She is not in acute distress.    Appearance: Normal appearance.  HENT:     Head: Normocephalic and atraumatic.     Mouth/Throat:     Mouth: Mucous membranes are moist.     Comments: Very poor dentition. Cardiovascular:     Rate and Rhythm: Normal rate and regular rhythm.  Pulmonary:     Effort: Pulmonary effort is normal.     Breath sounds: Normal breath sounds. No wheezing, rhonchi or rales.  Neurological:     Mental Status: She is alert.  Psychiatric:        Behavior: Behavior normal.     Comments: Tearful at times.    Lab Results  Component Value Date   WBC 9.1 04/20/2007   HGB 15.4 (H) 04/20/2007   HCT 44.1 04/20/2007   PLT 383 04/20/2007   GLUCOSE 96 12/29/2017   CHOL 181 12/29/2017   TRIG 133 12/29/2017    HDL 59 12/29/2017   LDLCALC 95 12/29/2017   ALT 18 12/29/2017   AST 24 12/29/2017   NA 137 12/29/2017   K 4.3 12/29/2017   CL 97 12/29/2017   CREATININE 1.31 (H) 12/29/2017   BUN 18 12/29/2017   CO2 25 12/29/2017   TSH 0.021 (L) 12/11/2020     Assessment & Plan:   Problem List Items Addressed This Visit       Endocrine   Hypothyroidism    Rechecking TSH today.  We will adjust her medication after TSH returns.      Relevant Medications   metoprolol succinate (TOPROL-XL) 50 MG 24 hr tablet   Other Relevant Orders   TSH     Other   Anxiety with depression    Seems to be better after going to a retreat.  Continue Xanax as directed.  Continue Zoloft      Relevant Medications   ALPRAZolam (XANAX) 1 MG tablet   sertraline (ZOLOFT) 50 MG tablet   Chronic pain - Primary    Stable.  Continuing patient's current regimen of hydrocodone.      Relevant Medications   HYDROcodone-acetaminophen (NORCO/VICODIN) 5-325 MG tablet   sertraline (ZOLOFT) 50 MG tablet   HYDROcodone-acetaminophen (NORCO) 5-325 MG tablet (Start on 04/16/2021)   HYDROcodone-acetaminophen (NORCO) 5-325 MG tablet (Start on 05/14/2021)   Other Visit Diagnoses     High risk medication use       Relevant Orders   CMP14+EGFR   Elevated serum creatinine       Relevant Orders   CMP14+EGFR       Meds ordered this encounter  Medications   HYDROcodone-acetaminophen (NORCO/VICODIN) 5-325 MG tablet    Sig: Take 1 tablet by mouth 4 (four) times daily as needed for severe pain. With the goal of taking 3 times daily as needed.    Dispense:  110 tablet    Refill:  0   ALPRAZolam (XANAX) 1 MG tablet    Sig: Take one tab po BID prn anxiety    Dispense:  60 tablet    Refill:  5    May fill monthly   metoprolol succinate (TOPROL-XL) 50 MG 24 hr tablet    Sig: Take 1 tablet (50 mg total) by mouth daily. TAKE WITH OR IMMEDIATELY FOLLOWING A MEAL.    Dispense:  90 tablet    Refill:  3   sertraline (ZOLOFT) 50 MG  tablet    Sig: Take 1 tablet (50 mg total) by mouth daily.    Dispense:  90 tablet  Refill:  3   HYDROcodone-acetaminophen (NORCO) 5-325 MG tablet    Sig: Take 1 tablet by mouth 4 (four) times daily as needed for severe pain. Goal of taking 3 times daily as needed.    Dispense:  30 tablet    Refill:  0   HYDROcodone-acetaminophen (NORCO) 5-325 MG tablet    Sig: Take 1 tablet by mouth 4 (four) times daily as needed for severe pain. With goal of taking 3 times daily as needed.    Dispense:  110 tablet    Refill:  0    Follow-up:  Return in about 3 months (around 06/14/2021).  [place]

## 2021-03-16 NOTE — Patient Instructions (Signed)
Labs today.  Follow up with Hoyle Sauer in 3 months.  Take care  Dr. Lacinda Axon

## 2021-03-16 NOTE — Assessment & Plan Note (Signed)
Stable.  Continuing patient's current regimen of hydrocodone.

## 2021-03-16 NOTE — Assessment & Plan Note (Signed)
Seems to be better after going to a retreat.  Continue Xanax as directed.  Continue Zoloft

## 2021-03-16 NOTE — Assessment & Plan Note (Signed)
Rechecking TSH today.  We will adjust her medication after TSH returns.

## 2021-03-17 ENCOUNTER — Other Ambulatory Visit: Payer: Self-pay | Admitting: Family Medicine

## 2021-03-17 LAB — CMP14+EGFR
ALT: 13 IU/L (ref 0–32)
AST: 24 IU/L (ref 0–40)
Albumin/Globulin Ratio: 2 (ref 1.2–2.2)
Albumin: 4.7 g/dL (ref 3.8–4.8)
Alkaline Phosphatase: 78 IU/L (ref 44–121)
BUN/Creatinine Ratio: 13 (ref 12–28)
BUN: 12 mg/dL (ref 8–27)
Bilirubin Total: 0.4 mg/dL (ref 0.0–1.2)
CO2: 24 mmol/L (ref 20–29)
Calcium: 10.1 mg/dL (ref 8.7–10.3)
Chloride: 101 mmol/L (ref 96–106)
Creatinine, Ser: 0.96 mg/dL (ref 0.57–1.00)
Globulin, Total: 2.4 g/dL (ref 1.5–4.5)
Glucose: 93 mg/dL (ref 70–99)
Potassium: 5.3 mmol/L — ABNORMAL HIGH (ref 3.5–5.2)
Sodium: 136 mmol/L (ref 134–144)
Total Protein: 7.1 g/dL (ref 6.0–8.5)
eGFR: 64 mL/min/{1.73_m2} (ref >59)

## 2021-03-17 LAB — TSH: TSH: 0.154 u[IU]/mL — ABNORMAL LOW (ref 0.450–4.500)

## 2021-03-17 MED ORDER — LEVOTHYROXINE SODIUM 88 MCG PO TABS: 88.0000 ug | ORAL_TABLET | Freq: Every day | ORAL | 0 refills | Status: DC

## 2021-04-15 ENCOUNTER — Other Ambulatory Visit: Payer: Self-pay | Admitting: Family Medicine

## 2021-04-15 ENCOUNTER — Telehealth: Payer: Self-pay | Admitting: Family Medicine

## 2021-04-15 MED ORDER — HYDROCODONE-ACETAMINOPHEN 10-325 MG PO TABS: 0.5000 | ORAL_TABLET | Freq: Four times a day (QID) | ORAL | 0 refills | Status: DC | PRN

## 2021-04-15 NOTE — Telephone Encounter (Signed)
Pt calling and states pharmacy is out of Hydrocodone ; there is a Tree surgeon and they have no expected time/date of when they will get some in. Pt was told by pharmacy to call PCP and have an alternative sent in. Pt states Dr.Cook seen her in January and knows about her swallowing issues and she has to cut up the pills. Please advise. Thank you  [facility]

## 2021-04-15 NOTE — Telephone Encounter (Signed)
Pt is OK with 10/325. Please advise. Thank you

## 2021-04-15 NOTE — Telephone Encounter (Signed)
[facility] Please advise. Thank you

## 2021-05-14 ENCOUNTER — Other Ambulatory Visit: Payer: Self-pay | Admitting: Family Medicine

## 2021-05-14 ENCOUNTER — Telehealth: Payer: Self-pay | Admitting: *Deleted

## 2021-05-14 MED ORDER — HYDROCODONE-ACETAMINOPHEN 10-325 MG PO TABS: 0.5000 | ORAL_TABLET | Freq: Four times a day (QID) | ORAL | 0 refills | Status: DC | PRN

## 2021-05-14 NOTE — Telephone Encounter (Signed)
Patient states she [facility]- the previous scripts were voided because last month she had to have a different script sent in because of med being on back order ?

## 2021-05-14 NOTE — Telephone Encounter (Signed)
Cook, Jayce G, DO     Rx was sent.    

## 2021-05-14 NOTE — Telephone Encounter (Signed)
Patient notified

## 2021-06-18 ENCOUNTER — Ambulatory Visit (INDEPENDENT_AMBULATORY_CARE_PROVIDER_SITE_OTHER): Payer: Medicare Other | Admitting: Nurse Practitioner

## 2021-06-18 VITALS — BP 167/114 | HR 69 | Temp 97.9°F | Ht 64.0 in | Wt 126.0 lb

## 2021-06-18 DIAGNOSIS — G8929 Other chronic pain: Secondary | ICD-10-CM | POA: Diagnosis not present

## 2021-06-18 DIAGNOSIS — Z85810 Personal history of malignant neoplasm of tongue: Secondary | ICD-10-CM | POA: Diagnosis not present

## 2021-06-18 DIAGNOSIS — E039 Hypothyroidism, unspecified: Secondary | ICD-10-CM | POA: Diagnosis not present

## 2021-06-18 DIAGNOSIS — Z79891 Long term (current) use of opiate analgesic: Secondary | ICD-10-CM | POA: Diagnosis not present

## 2021-06-18 MED ORDER — TIZANIDINE HCL 4 MG PO TABS: 4.0000 mg | ORAL_TABLET | Freq: Four times a day (QID) | ORAL | 0 refills | Status: DC | PRN

## 2021-06-18 MED ORDER — HYDROCODONE-ACETAMINOPHEN 10-325 MG PO TABS: 0.5000 | ORAL_TABLET | Freq: Four times a day (QID) | ORAL | 0 refills | Status: DC | PRN

## 2021-06-18 MED ORDER — HYDROCODONE-ACETAMINOPHEN 10-325 MG PO TABS: ORAL_TABLET | ORAL | 0 refills | Status: DC

## 2021-06-18 MED ORDER — HYDROCODONE-ACETAMINOPHEN 10-325 MG PO TABS
ORAL_TABLET | ORAL | 0 refills | Status: DC
Start: 2021-06-18 — End: 2021-09-18

## 2021-06-18 NOTE — Progress Notes (Signed)
? ?Subjective:  ? ? Patient ID: Cassandra Oliver, female    DOB: 1950-08-24, 71 y.o.   MRN: [id number] ? ?HPI ?This patient was seen today for chronic pain ? ?The medication list was reviewed and updated. ? ?Location of Pain for which the patient has been treated with regarding narcotics: hx tongue cancer ? ?Onset of this pain: chronic 2012 ? ? -Compliance with medication: daily ? ?- Number patient states they take daily: 3 to 4  ? ?-when was the last dose patient took? Noon today  ? ?The patient was advised the importance of maintaining medication and not using illegal substances with these. ? ?Here for refills and follow up ? ?The patient was educated that we can provide 3 monthly scripts for their medication, it is their responsibility to follow the instructions. ? ?Side effects or complications from medications:  ? ?Patient is aware that pain medications are meant to minimize the severity of the pain to allow their pain levels to improve to allow for better function. They are aware of that pain medications cannot totally remove their pain. ? ?Due for UDT ( at least once per year) : 10/2019 ? ?Scale of 1 to 10 ( 1 is least 10 is most) ?Your pain level without the medicine: 10 ?Your pain level with medication 7 ? ?Scale 1 to 10 ( 1-helps very little, 10 helps very well) ?How well does your pain medication reduce your pain so you can function better through out the day? 6 ? ?Quality of the pain: burning  ? ?Persistence of the pain: constant ? ?Modifying factors: nothing  ? ?Continues to struggle with mouth pain and now having tooth loss.  States she has been told that she cannot have tooth extraction due to possible bone issues in her mouth.  This began after her therapy for cancer.  States that everything burns her mouth or makes it hurt.  Tries to take 1 liquid supplement like boost or Ensure daily. ?Has been involved in [place] with groups of friends which has helped her with her socialization.  Continues to be  isolated from her family.  States she "lives like a hermit". ?Patient requesting a prescription for tizanidine to see if this will help her mouth pain.  Understands that this is a muscle relaxant and is not usually helpful for nerve pain. ?Adherent to her thyroid medication, is due for repeat lab work. ? ?  06/18/2021  ?  4:52 PM  ?Depression screen PHQ 2/9  ?Decreased Interest 2  ?Down, Depressed, Hopeless 2  ?PHQ - 2 Score 4  ?Altered sleeping 3  ?Tired, decreased energy 2  ?Change in appetite 3  ?Feeling bad or failure about yourself  1  ?Trouble concentrating 2  ?Moving slowly or fidgety/restless 0  ?Suicidal thoughts 0  ?PHQ-9 Score 15  ?Difficult doing work/chores Very difficult  ? ? ?   ? ?   ?Objective:  ? Physical Exam ?NAD.  Alert, oriented.  Calm cheerful affect.  Making good eye contact.  Dressed appropriately for the weather.  Good hygiene.  Speech clear.  Thoughts logical coherent and relevant.  Significant tooth decay and loss of teeth noted particularly along the lower jaw.  Lungs clear.  Heart regular rate rhythm. ? ?Today's Vitals  ? 06/18/21 1327  ?BP: (!) 167/114  ?Pulse: 69  ?Temp: 97.9 ?F (36.6 ?C)  ?SpO2: 97%  ?Weight: 126 lb (57.2 kg)  ?Height: '5\' 4"'$  (1.626 m)  ? ?Body mass index is 21.63 kg/m?. ?Last  TSH 03/16/2021 was 0.15 which was improved from previous visit. ? ? ? ? ?   ?Assessment & Plan:  ? ?Problem List Items Addressed This Visit   ? ?  ? Endocrine  ? Hypothyroidism  ? Relevant Orders  ? TSH (Completed)  ?  ? Other  ? Chronic pain  ? Relevant Medications  ? tiZANidine (ZANAFLEX) 4 MG tablet  ? HYDROcodone-acetaminophen (NORCO) 10-325 MG tablet  ? HYDROcodone-acetaminophen (NORCO) 10-325 MG tablet  ? HYDROcodone-acetaminophen (NORCO) 10-325 MG tablet  ? Encounter for long-term opiate analgesic use - Primary  ? Relevant Orders  ? ToxASSURE Select 13 (MW), Urine  ? History of tongue cancer  ? ?Meds ordered this encounter  ?Medications  ? tiZANidine (ZANAFLEX) 4 MG tablet  ?  Sig: Take 1  tablet (4 mg total) by mouth every 6 (six) hours as needed for muscle spasms.  ?  Dispense:  30 tablet  ?  Refill:  0  ?  Order Specific Question:   Supervising Provider  ?  Answer:   Sallee Lange A [[id number]]  ? HYDROcodone-acetaminophen (NORCO) 10-325 MG tablet  ?  Sig: Take 0.5 tablets by mouth 4 (four) times daily as needed for severe pain.  ?  Dispense:  55 tablet  ?  Refill:  0  ?  Order Specific Question:   Supervising Provider  ?  Answer:   Sallee Lange A [[id number]]  ? HYDROcodone-acetaminophen (NORCO) 10-325 MG tablet  ?  Sig: Take 1/2 tab po QID prn pain  ?  Dispense:  55 tablet  ?  Refill:  0  ?  May fill 30 days from 06/18/21  ?  Order Specific Question:   Supervising Provider  ?  Answer:   Sallee Lange A [[id number]]  ? HYDROcodone-acetaminophen (NORCO) 10-325 MG tablet  ?  Sig: Take 1/2 tab po QID prn pain  ?  Dispense:  55 tablet  ?  Refill:  0  ?  May fill 60 days from 06/18/21  ?  Order Specific Question:   Supervising Provider  ?  Answer:   Sallee Lange A [[id number]]  ? ?Continue hydrocodone at current dose. ?Trial of tizanidine as directed to see if this will help with her pain.  Drowsiness precautions. ?Encourage patient to take daily supplement with vitamins and protein such as boost or Ensure. ?Encourage patient to continue socialization outside the home. ?Repeat TSH ordered.  Routine urine drug screen ordered. ?Return in about 3 months (around 09/17/2021). ? ? ?

## 2021-06-19 ENCOUNTER — Other Ambulatory Visit: Payer: Self-pay | Admitting: Nurse Practitioner

## 2021-06-19 LAB — TSH: TSH: 0.424 u[IU]/mL — ABNORMAL LOW (ref 0.450–4.500)

## 2021-06-19 MED ORDER — LEVOTHYROXINE SODIUM 75 MCG PO TABS: 75.0000 ug | ORAL_TABLET | Freq: Every day | ORAL | 0 refills | Status: DC

## 2021-06-20 ENCOUNTER — Encounter: Payer: Self-pay | Admitting: Nurse Practitioner

## 2021-06-21 NOTE — Addendum Note (Signed)
Addended by: Dairl Ponder on: 06/21/2021 03:18 PM ? ? Modules accepted: Orders ? ?

## 2021-06-24 LAB — TOXASSURE SELECT 13 (MW), URINE

## 2021-09-13 ENCOUNTER — Other Ambulatory Visit: Payer: Self-pay | Admitting: Nurse Practitioner

## 2021-09-17 ENCOUNTER — Ambulatory Visit (INDEPENDENT_AMBULATORY_CARE_PROVIDER_SITE_OTHER): Payer: Medicare Other | Admitting: Nurse Practitioner

## 2021-09-17 VITALS — BP 160/102 | HR 72 | Temp 98.1°F | Ht 64.0 in | Wt 121.0 lb

## 2021-09-17 DIAGNOSIS — Z85810 Personal history of malignant neoplasm of tongue: Secondary | ICD-10-CM

## 2021-09-17 DIAGNOSIS — E039 Hypothyroidism, unspecified: Secondary | ICD-10-CM | POA: Diagnosis not present

## 2021-09-17 DIAGNOSIS — Z79891 Long term (current) use of opiate analgesic: Secondary | ICD-10-CM | POA: Diagnosis not present

## 2021-09-17 DIAGNOSIS — G8929 Other chronic pain: Secondary | ICD-10-CM

## 2021-09-17 DIAGNOSIS — R6889 Other general symptoms and signs: Secondary | ICD-10-CM

## 2021-09-17 DIAGNOSIS — I10 Essential (primary) hypertension: Secondary | ICD-10-CM | POA: Diagnosis not present

## 2021-09-17 DIAGNOSIS — T63441A Toxic effect of venom of bees, accidental (unintentional), initial encounter: Secondary | ICD-10-CM

## 2021-09-17 NOTE — Progress Notes (Unsigned)
This patient was seen today for chronic pain  The medication list was reviewed and updated.  Location of Pain for which the patient has been treated with regarding narcotics: ***  Onset of this pain: ***   -Compliance with medication: ***  - Number patient states they take daily: ***  -when was the last dose patient took? ***  The patient was advised the importance of maintaining medication and not using illegal substances with these.  Here for refills and follow up  The patient was educated that we can provide 3 monthly scripts for their medication, it is their responsibility to follow the instructions.  Side effects or complications from medications: ***  Patient is aware that pain medications are meant to minimize the severity of the pain to allow their pain levels to improve to allow for better function. They are aware of that pain medications cannot totally remove their pain.  Due for UDT ( at least once per year) : ***  Scale of 1 to 10 ( 1 is least 10 is most) Your pain level without the medicine: *** Your pain level with medication ***  Scale 1 to 10 ( 1-helps very little, 10 helps very well) How well does your pain medication reduce your pain so you can function better through out the day? ***  Quality of the pain: ***  Persistence of the pain: ***  Modifying factors: ***

## 2021-09-18 ENCOUNTER — Encounter: Payer: Self-pay | Admitting: Nurse Practitioner

## 2021-09-18 DIAGNOSIS — I10 Essential (primary) hypertension: Secondary | ICD-10-CM | POA: Insufficient documentation

## 2021-09-18 MED ORDER — CLOBETASOL PROPIONATE 0.05 % EX CREA: 1.0000 | TOPICAL_CREAM | Freq: Two times a day (BID) | CUTANEOUS | 0 refills | Status: DC

## 2021-09-18 MED ORDER — HYDROCODONE-ACETAMINOPHEN 10-325 MG PO TABS
ORAL_TABLET | ORAL | 0 refills | Status: DC
Start: 2021-09-18 — End: 2021-12-17

## 2021-09-18 MED ORDER — ALPRAZOLAM 1 MG PO TABS: ORAL_TABLET | ORAL | 5 refills | Status: DC

## 2021-09-18 MED ORDER — HYDROCODONE-ACETAMINOPHEN 10-325 MG PO TABS: ORAL_TABLET | ORAL | 0 refills | Status: DC

## 2021-09-18 MED ORDER — HYDROCODONE-ACETAMINOPHEN 10-325 MG PO TABS: 0.5000 | ORAL_TABLET | Freq: Four times a day (QID) | ORAL | 0 refills | Status: DC | PRN

## 2021-09-18 NOTE — Progress Notes (Signed)
Subjective:    Patient ID: Cassandra Oliver, female    DOB: March 27, 1950, 71 y.o.   MRN: [id number]  HPI Subjective: This patient was seen today for chronic pain  The medication list was reviewed and updated.  Location of Pain for which the patient has been treated with regarding narcotics: mainly mouth and tongue from previous cancer  Onset of this pain: chronic   -Compliance with medication: yes  - Number patient states they take daily: 1/2 tab po QID but she is running out since she only gets 55 pills  -when was the last dose patient took? today  The patient was advised the importance of maintaining medication and not using illegal substances with these.  Here for refills and follow up  The patient was educated that we can provide 3 monthly scripts for their medication, it is their responsibility to follow the instructions.  Side effects or complications from medications: none  Patient is aware that pain medications are meant to minimize the severity of the pain to allow their pain levels to improve to allow for better function. They are aware of that pain medications cannot totally remove their pain.  Due for UDT ( at least once per year) : UTD Still struggling with eating. Can get soft foods down. Difficulty chewing and her tongue burns with most foods. Cannot take liquid supplements like Boost due to tongue burning. Tries to eat on a regular basis.  According to patient her BP outside of the office runs 125-126/78-82.  Defers thyroid labs at this time. States she feels fine on the new dose. Would like to wait until next visit.  Had a bee sting on her left hand a few days ago. Localized swelling and itching. Applied several topicals and took Benadryl. Has improved but still slightly itchy. No generalized allergic reaction. Also a sting on the left lateral chest area.  Has tried Triamcinolone for other issues in the past but did not work. Would like a different cream.  Current dose  of Xanax is working well. She is trying to get out more. Recently went to a movie alone which she enjoyed.    Review of Systems  Constitutional:  Positive for fatigue and unexpected weight change.       Mild fatigue; no change.  HENT:  Positive for dental problem and trouble swallowing.        Chronic mouth pain, burning tongue. Chews and swallows soft foods and liquids.   Respiratory:  Negative for cough, chest tightness and shortness of breath.   Cardiovascular:  Negative for chest pain, palpitations and leg swelling.  Skin:        Two bee stings.  Neurological:  Negative for syncope, facial asymmetry, speech difficulty and weakness.       Objective:   Physical Exam NAD.  Alert, oriented.  Cheerful affect.  Making good eye contact.  Speech clear.  Dressed appropriately for the weather.  Thoughts logical coherent and relevant.  Thyroid nontender to palpation, no mass or goiter noted.  Lungs clear.  Carotids no bruits or thrills noted.  Heart regular rate rhythm without murmur or gallop.  Lower extremities no edema.  Minimal edema noted on the dorsal aspect of the right hand, very faint pink discoloration.  Some signs of mild excoriation.  Nontender to palpation.  Approximately 2 cm pink slightly raised area with a tiny hole in the cente at the site of a second bee sting noted in the left lateral mid chest area.  Today's Vitals   09/17/21 1328 09/17/21 1432  BP: (!) 160/100 (!) 160/102  Pulse: 72   Temp: 98.1 F (36.7 C)   TempSrc: Temporal   SpO2: 99%   Weight: 121 lb (54.9 kg)   Height: '5\' 4"'$  (1.626 m)    Body mass index is 20.77 kg/m.        Assessment & Plan:   Problem List Items Addressed This Visit       Cardiovascular and Mediastinum   Primary hypertension     Endocrine   Hypothyroidism     Other   Chronic pain   Relevant Medications   HYDROcodone-acetaminophen (NORCO) 10-325 MG tablet   HYDROcodone-acetaminophen (NORCO) 10-325 MG tablet    HYDROcodone-acetaminophen (NORCO) 10-325 MG tablet   Encounter for long-term opiate analgesic use - Primary   History of tongue cancer   Unintentional weight change   Other Visit Diagnoses     Bee sting, accidental or unintentional, initial encounter            Meds ordered this encounter  Medications   clobetasol cream (TEMOVATE) 0.05 %    Sig: Apply 1 Application topically 2 (two) times daily. Prn up to 2 weeks at a time    Dispense:  30 g    Refill:  0    Order Specific Question:   Supervising Provider    Answer:   Sallee Lange A [[id number]]   ALPRAZolam Duanne Moron) 1 MG tablet    Sig: Take one tab po BID prn anxiety    Dispense:  60 tablet    Refill:  5    May fill monthly    Order Specific Question:   Supervising Provider    Answer:   Sallee Lange A [[id number]]   HYDROcodone-acetaminophen (NORCO) 10-325 MG tablet    Sig: Take 0.5 tablets by mouth 4 (four) times daily as needed for severe pain.    Dispense:  60 tablet    Refill:  0    Order Specific Question:   Supervising Provider    Answer:   Sallee Lange A [[id number]]   HYDROcodone-acetaminophen (NORCO) 10-325 MG tablet    Sig: Take 1/2 tab po QID prn pain    Dispense:  60 tablet    Refill:  0    May fill 30 days from 09/18/21    Order Specific Question:   Supervising Provider    Answer:   Sallee Lange A [[id number]]   HYDROcodone-acetaminophen (NORCO) 10-325 MG tablet    Sig: Take 1/2 tab po QID prn pain    Dispense:  60 tablet    Refill:  0    May fill 60 days from 09/18/21    Order Specific Question:   Supervising Provider    Answer:   Kathyrn Drown [phone]   Encourage patient to consider adding on medication for her blood pressure, defers at this time.  Continue metoprolol at this point. Increased her hydrocodone to 60/month which allows her to take a fourth of a tab 4 times a day as needed for pain. Continue Xanax as directed.  Cautioned about drowsiness taking both of these medications.  Note she has been on them long-term  without any issues. Recommend repeat TSH, patient defers today will reconsider at her next visit in 3 months. Encouraged regular meals and snacking including fluids and soft foods. Clobetasol cream twice daily to bee stings as needed up to 2 weeks.  Continue Benadryl as directed especially at nighttime for itching.  Expect continued  gradual resolution.  There is no evidence of generalized reaction or anaphylaxis, no EpiPen is indicated at this time.  Reviewed first-aid care for bee stings for the future. Return in about 3 months (around 12/18/2021). Call back sooner if needed.

## 2021-10-12 ENCOUNTER — Telehealth: Payer: Self-pay | Admitting: *Deleted

## 2021-10-12 NOTE — Patient Outreach (Signed)
  Care Coordination   10/12/2021 Name: Vonceil Upshur MRN: [id number] DOB: Jun 18, 1950   Care Coordination Outreach Attempts:  An unsuccessful telephone outreach was attempted today to offer the patient information about available care coordination services as a benefit of their health plan.   Follow Up Plan:  Additional outreach attempts will be made to offer the patient care coordination information and services.   Encounter Outcome:  No Answer  Care Coordination Interventions Activated:  No   Care Coordination Interventions:  No, not indicated    Jacqlyn Larsen [facility], BSN RN Case Manager [phone]

## 2021-10-15 ENCOUNTER — Telehealth: Payer: Self-pay | Admitting: *Deleted

## 2021-10-15 NOTE — Patient Outreach (Signed)
  Care Coordination   10/15/2021 Name: Graceann Boileau MRN: [id number] DOB: 1950/10/04   Care Coordination Outreach Attempts:  A second unsuccessful outreach was attempted today to offer the patient with information about available care coordination services as a benefit of their health plan.     Follow Up Plan:  Additional outreach attempts will be made to offer the patient care coordination information and services.   Encounter Outcome:  No Answer  Care Coordination Interventions Activated:  No   Care Coordination Interventions:  No, not indicated    Jacqlyn Larsen [facility], BSN RN Care Coordinator [phone]

## 2021-10-20 ENCOUNTER — Telehealth: Payer: Self-pay | Admitting: *Deleted

## 2021-10-20 ENCOUNTER — Encounter: Payer: Self-pay | Admitting: *Deleted

## 2021-10-20 NOTE — Patient Outreach (Signed)
  Care Coordination   Initial Visit Note   10/20/2021 Name: Cassandra Oliver MRN: [id number] DOB: [birth year]  Cassandra Oliver is a 71 y.o. year old female who sees Coral Spikes, DO for primary care. I spoke with  Cassandra Oliver by phone today.  What matters to the patients health and wellness today?  "Continue managing my health"    Goals Addressed               This Visit's Progress     COMPLETED: "Continue managing my health" (pt-stated)        Care Coordination Interventions: Patient interviewed about adult health maintenance status including  Annual Wellness Visit, patient states "not sure if I have had annual wellness visit but I don't usually do this and probably won't" Advised patient to discuss  pain management  with primary care provider  Provided education about pain management strategies including rest and relaxation, keeping stress to a minimum Reviewed importance of Annual Wellness Visit Explained [facility] care coordintion program, pt agreeable to today's outreach but declines further outreach Provided RN care coordinator contact information          SDOH assessments and interventions completed:  Yes  SDOH Interventions Today    Flowsheet Row Most Recent Value  SDOH Interventions   Food Insecurity Interventions Intervention Not Indicated  Housing Interventions Intervention Not Indicated  Transportation Interventions Intervention Not Indicated        Care Coordination Interventions Activated:  Yes  Care Coordination Interventions:  Yes, provided   Follow up plan: No further intervention required.   Encounter Outcome:  Pt. Visit Completed   Jacqlyn Larsen [facility], BSN RN Care Coordinator [phone]

## 2021-10-20 NOTE — Patient Outreach (Signed)
  Care Coordination   10/20/2021 Name: [name] MRN: [id number] DOB: 04/21/1950   Care Coordination Outreach Attempts:  A third unsuccessful outreach was attempted today to offer the patient with information about available care coordination services as a benefit of their health plan.   Follow Up Plan:  No further outreach attempts will be made at this time. We have been unable to contact the patient to offer or enroll patient in care coordination services  Encounter Outcome:  No Answer  Care Coordination Interventions Activated:  No   Care Coordination Interventions:  No, not indicated    Jacqlyn Larsen [facility], BSN RN Care Coordinator [phone]

## 2021-11-16 ENCOUNTER — Telehealth: Payer: Self-pay | Admitting: Family Medicine

## 2021-11-16 NOTE — Telephone Encounter (Signed)
Patient declined the Medicare Wellness Visit with [facility]   Doesn't feel it is necessary.

## 2021-12-14 ENCOUNTER — Other Ambulatory Visit: Payer: Self-pay | Admitting: Family Medicine

## 2021-12-17 ENCOUNTER — Ambulatory Visit (INDEPENDENT_AMBULATORY_CARE_PROVIDER_SITE_OTHER): Payer: Medicare Other | Admitting: Nurse Practitioner

## 2021-12-17 VITALS — BP 175/117 | HR 79 | Temp 97.7°F | Ht 64.0 in | Wt 128.0 lb

## 2021-12-17 DIAGNOSIS — G8929 Other chronic pain: Secondary | ICD-10-CM

## 2021-12-17 DIAGNOSIS — I10 Essential (primary) hypertension: Secondary | ICD-10-CM

## 2021-12-17 DIAGNOSIS — G4700 Insomnia, unspecified: Secondary | ICD-10-CM | POA: Diagnosis not present

## 2021-12-17 DIAGNOSIS — Z85810 Personal history of malignant neoplasm of tongue: Secondary | ICD-10-CM | POA: Diagnosis not present

## 2021-12-17 DIAGNOSIS — F418 Other specified anxiety disorders: Secondary | ICD-10-CM

## 2021-12-17 DIAGNOSIS — Z79891 Long term (current) use of opiate analgesic: Secondary | ICD-10-CM

## 2021-12-17 MED ORDER — TRAZODONE HCL 50 MG PO TABS: ORAL_TABLET | ORAL | 0 refills | Status: DC

## 2021-12-17 MED ORDER — HYDROCODONE-ACETAMINOPHEN 10-325 MG PO TABS
0.5000 | ORAL_TABLET | Freq: Four times a day (QID) | ORAL | 0 refills | Status: DC | PRN
Start: 2021-12-17 — End: 2021-12-22

## 2021-12-17 MED ORDER — LISINOPRIL 10 MG PO TABS: 10.0000 mg | ORAL_TABLET | Freq: Every day | ORAL | 0 refills | Status: DC

## 2021-12-17 MED ORDER — HYDROCODONE-ACETAMINOPHEN 10-325 MG PO TABS: ORAL_TABLET | ORAL | 0 refills | Status: DC

## 2021-12-17 NOTE — Patient Instructions (Signed)
Start BP pill first, monitor BP and sent results to office After start Trazodone at night time for sleep

## 2021-12-17 NOTE — Progress Notes (Unsigned)
Subjective:    Patient ID: Cassandra Oliver, female    DOB: [birth year], 71 y.o.   MRN: [id number]  HPI This patient was seen today for chronic pain  The medication list was reviewed and updated.  Location of Pain for which the patient has been treated with regarding narcotics: tongue, mouth and face  Onset of this pain: 2012 cancer/ radiation   -Compliance with medication: daily  - Number patient states they take daily: 2 per day  -when was the last dose patient took? 12:30 today  The patient was advised the importance of maintaining medication and not using illegal substances with these.  Here for refills and follow up  The patient was educated that we can provide 3 monthly scripts for their medication, it is their responsibility to follow the instructions.  Side effects or complications from medications: no  Patient is aware that pain medications are meant to minimize the severity of the pain to allow their pain levels to improve to allow for better function. They are aware of that pain medications cannot totally remove their pain.  Due for UDT ( at least once per year) : 06/18/21  Scale of 1 to 10 ( 1 is least 10 is most) Your pain level without the medicine: 10 + Your pain level with medication 7  Scale 1 to 10 ( 1-helps very little, 10 helps very well) How well does your pain medication reduce your pain so you can function better through out the day? 5 Pain medication allows her to function and perform ADLs. BP has been significantly elevated on her last few visits at our office.  States it is also running high at home.  Patient is willing to add on a second medication to help with blood pressure.   Having difficulty sleeping at nighttime.  States this is a long-term problem.  Has a sleep log with her today.  Review of this shows that most nights she is up 2-3 times per night and at least 1 night per week gets no sleep at all.  She states this is normal for her. Has a friend  that is going to come visit her soon.  Tries to stay busy as possible with her dogs.    12/17/2021    2:24 PM  Depression screen PHQ 2/9  Decreased Interest 3  Down, Depressed, Hopeless 1  PHQ - 2 Score 4  Altered sleeping 3  Tired, decreased energy 1  Change in appetite 3  Feeling bad or failure about yourself  0  Trouble concentrating 0  Moving slowly or fidgety/restless 1  Suicidal thoughts 0  PHQ-9 Score 12  Difficult doing work/chores Not difficult at all      12/17/2021    2:24 PM 12/17/2021    1:41 PM 09/11/2020    1:48 PM 01/04/2018    3:14 PM  GAD 7 : Generalized Anxiety Score  Nervous, Anxious, on Edge '2 2 3 1  '$ Control/stop worrying '2 2 3 1  '$ Worry too much - different things '2 2 3 1  '$ Trouble relaxing '1 1 1 1  '$ Restless 0 0 1 0  Easily annoyed or irritable 0 0 0 1  Afraid - awful might happen '1 1 1 1  '$ Total GAD 7 Score '8 8 12 6  '$ Anxiety Difficulty Not difficult at all Not difficult at all Somewhat difficult Somewhat difficult     Review of Systems  Constitutional:  Positive for fatigue.  Respiratory:  Negative for cough, chest  tightness, shortness of breath and wheezing.   Cardiovascular:  Negative for chest pain, palpitations and leg swelling.  Neurological:  Negative for facial asymmetry, speech difficulty, weakness and numbness.  Psychiatric/Behavioral:  Positive for dysphoric mood and sleep disturbance. Negative for suicidal ideas. The patient is nervous/anxious.        Objective:   Physical Exam NAD.  Alert, oriented.  Mildly anxious affect.  Cheerful and smiling.  Making good eye contact.  Speech clear.  Dressed appropriately for the weather.  Thoughts logical coherent and relevant.  Lungs clear.  Heart regular rate rhythm.  Carotids no bruits or thrills.  Lower extremities no edema. Today's Vitals   12/17/21 1336  BP: (!) 175/117  Pulse: 79  Temp: 97.7 F (36.5 C)  SpO2: 97%  Weight: 128 lb (58.1 kg)  Height: '5\' 4"'$  (1.626 m)   Body mass index  is 21.97 kg/m.      Assessment & Plan:   Problem List Items Addressed This Visit       Cardiovascular and Mediastinum   Primary hypertension   Relevant Medications   lisinopril (ZESTRIL) 10 MG tablet     Other   Anxiety with depression   Relevant Medications   traZODone (DESYREL) 50 MG tablet   Chronic pain   Relevant Medications   HYDROcodone-acetaminophen (NORCO) 10-325 MG tablet   HYDROcodone-acetaminophen (NORCO) 10-325 MG tablet   HYDROcodone-acetaminophen (NORCO) 10-325 MG tablet   traZODone (DESYREL) 50 MG tablet   Encounter for long-term opiate analgesic use - Primary   History of tongue cancer   Insomnia   Meds ordered this encounter  Medications   HYDROcodone-acetaminophen (NORCO) 10-325 MG tablet    Sig: Take 0.5 tablets by mouth 4 (four) times daily as needed for severe pain.    Dispense:  60 tablet    Refill:  0    Order Specific Question:   Supervising Provider    Answer:   Sallee Lange A [[id number]]   HYDROcodone-acetaminophen (NORCO) 10-325 MG tablet    Sig: Take 1/2 tab po QID prn pain    Dispense:  60 tablet    Refill:  0    May fill 30 days from 12/17/21    Order Specific Question:   Supervising Provider    Answer:   Sallee Lange A [[id number]]   HYDROcodone-acetaminophen (NORCO) 10-325 MG tablet    Sig: Take 1/2 tab po QID prn pain    Dispense:  60 tablet    Refill:  0    May fill 60 days from 12/17/21    Order Specific Question:   Supervising Provider    Answer:   Sallee Lange A [[id number]]   traZODone (DESYREL) 50 MG tablet    Sig: Take 1/2 tab po QHS for sleep    Dispense:  15 tablet    Refill:  0    Order Specific Question:   Supervising Provider    Answer:   Sallee Lange A [[id number]]   lisinopril (ZESTRIL) 10 MG tablet    Sig: Take 1 tablet (10 mg total) by mouth daily. For BP    Dispense:  90 tablet    Refill:  0    Order Specific Question:   Supervising Provider    Answer:   Sallee Lange A [[id number]]   Continue current pain medication  regimen. Start lisinopril 10 mg daily, continue to monitor blood pressure and send results to the office in 2 weeks. Continue metoprolol as directed. Continue Zoloft as directed.  Trial of  low-dose trazodone 50 mg half tab at night for sleep.  Start this after she gets stable on new blood pressure medication.  Reviewed potential adverse effects.  Discontinue medication and contact office if any problems. Return in about 3 months (around 03/19/2022).

## 2021-12-18 ENCOUNTER — Encounter: Payer: Self-pay | Admitting: Nurse Practitioner

## 2021-12-20 ENCOUNTER — Other Ambulatory Visit: Payer: Self-pay | Admitting: Family Medicine

## 2021-12-20 ENCOUNTER — Telehealth: Payer: Self-pay

## 2021-12-20 MED ORDER — HYDROCODONE-ACETAMINOPHEN 5-325 MG PO TABS: 1.0000 | ORAL_TABLET | Freq: Two times a day (BID) | ORAL | 0 refills | Status: DC

## 2021-12-20 NOTE — Telephone Encounter (Signed)
Pt states Hydrocodone 10-325 mg is on back order. Pt [facility] in [facility]. Please advise. Thank you

## 2021-12-20 NOTE — Telephone Encounter (Signed)
Encourage patient to contact the pharmacy for refills or they can request refills through [facility]  (Please schedule appointment if patient has not been seen in over a year)    WHAT PHARMACY WOULD THEY LIKE THIS SENT TO: [facility] [place]   MEDICATION NAME & DOSE:HYDROcodone-acetaminophen ([place]) 10-325 MG tablet...Marland KitchenMarland Kitchen  pt need HYDRocodone-acetaminophen 58-325 [facility] 10-325 pt is wanting to see if this can be change to match her quantity that she normally takes on the 10-325  NOTES/COMMENTS FROM PATIENT:Pt seen Hoyle Sauer this past Friday regarding this       [place] office please notify patient: It takes 48-72 hours to process rx refill requests Ask patient to call pharmacy to ensure rx is ready before heading there.

## 2021-12-21 NOTE — Telephone Encounter (Signed)
Pt contacted and verbalized understanding.  

## 2021-12-22 ENCOUNTER — Other Ambulatory Visit: Payer: Self-pay | Admitting: Family Medicine

## 2021-12-22 ENCOUNTER — Telehealth: Payer: Self-pay | Admitting: Family Medicine

## 2021-12-22 MED ORDER — HYDROCODONE-ACETAMINOPHEN 5-325 MG PO TABS: 1.0000 | ORAL_TABLET | Freq: Four times a day (QID) | ORAL | 0 refills | Status: DC | PRN

## 2021-12-22 NOTE — Telephone Encounter (Signed)
Pt calling in and is wanting to know why Dr.Cook only gave her 60 tablets of the 5-325 Hydrocodone. Pt states with the 10-325 she was taking 1/2 tablet 4 times a day. Pt states she takes 4 a day not 2. Explained to patient that essentially she is only taking 2 a day (1/2 4 times a day). Pt understood but states she should be given 120 instead of 60. Advised pt that provider is out this afternoon. Please advise. Thank you

## 2021-12-22 NOTE — Telephone Encounter (Signed)
Per provider; he will change the quantity and calculate the date she can get them filled when she runs out. Pt contacted and verbalized understanding.

## 2022-01-05 ENCOUNTER — Other Ambulatory Visit: Payer: Self-pay | Admitting: Nurse Practitioner

## 2022-01-05 ENCOUNTER — Other Ambulatory Visit: Payer: Self-pay | Admitting: Family Medicine

## 2022-01-10 ENCOUNTER — Other Ambulatory Visit: Payer: Self-pay | Admitting: Nurse Practitioner

## 2022-02-03 ENCOUNTER — Telehealth: Payer: Self-pay | Admitting: Family Medicine

## 2022-02-03 NOTE — Telephone Encounter (Signed)
Patient is requesting refill on hydrocodone to be called into [facility] [place] she has appointment for follow up on 03/18/22 with Hoyle Sauer.

## 2022-02-04 ENCOUNTER — Other Ambulatory Visit: Payer: Self-pay | Admitting: Nurse Practitioner

## 2022-02-04 MED ORDER — HYDROCODONE-ACETAMINOPHEN 5-325 MG PO TABS: 1.0000 | ORAL_TABLET | Freq: Four times a day (QID) | ORAL | 0 refills | Status: DC | PRN

## 2022-02-04 NOTE — Telephone Encounter (Signed)
Done

## 2022-02-11 ENCOUNTER — Other Ambulatory Visit: Payer: Self-pay | Admitting: Nurse Practitioner

## 2022-03-09 ENCOUNTER — Telehealth: Payer: Self-pay | Admitting: Family Medicine

## 2022-03-09 ENCOUNTER — Other Ambulatory Visit: Payer: Self-pay | Admitting: Family Medicine

## 2022-03-09 NOTE — Telephone Encounter (Signed)
Pt requesting refill on Hydrocodone 5-325 mg. Pt last seen 12/17/21 with Hoyle Sauer. Pt has appt on 03/18/22. Please advise. Thank you (Pt states PCP does this but has been seeing Hoyle Sauer)  [place]

## 2022-03-10 ENCOUNTER — Other Ambulatory Visit: Payer: Self-pay | Admitting: Family Medicine

## 2022-03-10 MED ORDER — HYDROCODONE-ACETAMINOPHEN 5-325 MG PO TABS: 1.0000 | ORAL_TABLET | Freq: Four times a day (QID) | ORAL | 0 refills | Status: DC | PRN

## 2022-03-10 NOTE — Telephone Encounter (Signed)
Pt contacted and verbalized understanding.  

## 2022-03-15 ENCOUNTER — Other Ambulatory Visit: Payer: Self-pay | Admitting: Family Medicine

## 2022-03-18 ENCOUNTER — Ambulatory Visit (INDEPENDENT_AMBULATORY_CARE_PROVIDER_SITE_OTHER): Payer: Medicare Other | Admitting: Nurse Practitioner

## 2022-03-18 VITALS — BP 128/82 | Ht 64.0 in | Wt 132.2 lb

## 2022-03-18 DIAGNOSIS — Z79891 Long term (current) use of opiate analgesic: Secondary | ICD-10-CM

## 2022-03-18 DIAGNOSIS — G8929 Other chronic pain: Secondary | ICD-10-CM | POA: Diagnosis not present

## 2022-03-18 DIAGNOSIS — G4700 Insomnia, unspecified: Secondary | ICD-10-CM | POA: Diagnosis not present

## 2022-03-18 DIAGNOSIS — F418 Other specified anxiety disorders: Secondary | ICD-10-CM

## 2022-03-18 MED ORDER — MIRTAZAPINE 7.5 MG PO TABS: 7.5000 mg | ORAL_TABLET | Freq: Every day | ORAL | 0 refills | Status: DC

## 2022-03-18 NOTE — Progress Notes (Unsigned)
Subjective:    Patient ID: Cassandra Oliver, female    DOB: [birth year], 71 y.o.   MRN: [id number]  HPI Patient arrives for a follow up on pain management. This patient was seen today for chronic pain  The medication list was reviewed and updated.  Location of Pain for which the patient has been treated with regarding narcotics: mouth and tongue  Onset of this pain: years   -Compliance with medication: yes  - Number patient states they take daily: 4 a day  -when was the last dose patient took? today  The patient was advised the importance of maintaining medication and not using illegal substances with these.  Here for refills and follow up  The patient was educated that we can provide 3 monthly scripts for their medication, it is their responsibility to follow the instructions.  Side effects or complications from medications: none  Patient is aware that pain medications are meant to minimize the severity of the pain to allow their pain levels to improve to allow for better function. They are aware of that pain medications cannot totally remove their pain.  Due for UDT ( at least once per year) : 06/18/21  Patient states she is still having a lot of pain and not sleeping - would like to increase xanax to 3 times a day to take one at bedtime for sleep Discontinue trazodone after several doses due to blurred vision 1 night when she woke up. Has been under increased stress lately because her husband will need to be transferred to a different facility in March.  Patient is dealing with financial issues as well as paperwork. Her weight has improved because of improved intake.  States her neighbor has been bringing her homemade soup.  Continues to have significant issues with poor dentition pain and swallowing issues. Support system is mainly her friends and neighbors.     Review of Systems  HENT:         Chronic mouth and tongue pain.  Also pain along the left jaw area.  Respiratory:   Negative for cough, chest tightness and shortness of breath.   Cardiovascular:  Negative for chest pain.      Objective:   Physical Exam NAD.  Alert, oriented.  Calm affect.  Making good eye contact.  Speech clear.  Thoughts logical coherent and relevant.  Dressed appropriately for the weather.  Lungs clear.  Heart regular rate rhythm. Today's Vitals   03/18/22 1335  BP: 128/82  Weight: 132 lb 3.2 oz (60 kg)  Height: '5\' 4"'$  (1.626 m)   Body mass index is 22.69 kg/m. Has gained 4 pounds since her visit in October.      Assessment & Plan:   Problem List Items Addressed This Visit       Other   Anxiety with depression   Relevant Medications   mirtazapine (REMERON) 7.5 MG tablet   Chronic pain   Relevant Medications   mirtazapine (REMERON) 7.5 MG tablet   Encounter for long-term opiate analgesic use - Primary   Insomnia   Meds ordered this encounter  Medications   mirtazapine (REMERON) 7.5 MG tablet    Sig: Take 1 tablet (7.5 mg total) by mouth at bedtime. For sleep    Dispense:  30 tablet    Refill:  0    Order Specific Question:   Supervising Provider    Answer:   Sallee Lange A [[id number]]   ALPRAZolam (XANAX) 1 MG tablet    Sig: Take  one tab po BID prn anxiety    Dispense:  60 tablet    Refill:  5    May fill monthly    Order Specific Question:   Supervising Provider    Answer:   Sallee Lange A [[id number]]   HYDROcodone-acetaminophen (NORCO/VICODIN) 5-325 MG tablet    Sig: Take 1 tablet by mouth 4 (four) times daily as needed for severe pain.    Dispense:  120 tablet    Refill:  0    May fill 30 days from 03/10/22    Order Specific Question:   Supervising Provider    Answer:   Sallee Lange A [[id number]]   HYDROcodone-acetaminophen (NORCO/VICODIN) 5-325 MG tablet    Sig: Take one tab po QID prn severe pain    Dispense:  120 tablet    Refill:  0    May fill 60 days from 03/10/22    Order Specific Question:   Supervising Provider    Answer:   Sallee Lange A [[id number]]    HYDROcodone-acetaminophen (NORCO/VICODIN) 5-325 MG tablet    Sig: Take one tab po QID prn severe pain    Dispense:  120 tablet    Refill:  0    May fill 90 days from 03/10/22    Order Specific Question:   Supervising Provider    Answer:   Kathyrn Drown [phone]   Per patient request, consult with Dr. Lacinda Axon.  It is agreed that we should not increase the dose of Xanax due to risks. Trial of of mirtazapine low-dose at bedtime for sleep.  This was done after the visit and note sent to the nurse to notify patient regarding new medication. Return in about 3 months (around 06/17/2022) for Annual Medicare exam and pain management next visit. Call back sooner if needed.

## 2022-03-19 ENCOUNTER — Encounter: Payer: Self-pay | Admitting: Nurse Practitioner

## 2022-03-19 MED ORDER — ALPRAZOLAM 1 MG PO TABS: ORAL_TABLET | ORAL | 5 refills | Status: DC

## 2022-03-19 MED ORDER — HYDROCODONE-ACETAMINOPHEN 5-325 MG PO TABS: ORAL_TABLET | ORAL | 0 refills | Status: DC

## 2022-03-19 MED ORDER — HYDROCODONE-ACETAMINOPHEN 5-325 MG PO TABS: 1.0000 | ORAL_TABLET | Freq: Four times a day (QID) | ORAL | 0 refills | Status: DC | PRN

## 2022-03-25 ENCOUNTER — Telehealth: Payer: Self-pay | Admitting: Family Medicine

## 2022-03-25 NOTE — Telephone Encounter (Signed)
Pt left voicemail regarding Mirtazapine. Pt states she was not aware of this medication being sent in and pt would like to speak with [facility] directly. Please advise. Thank you

## 2022-04-01 ENCOUNTER — Other Ambulatory Visit: Payer: Self-pay

## 2022-04-01 ENCOUNTER — Telehealth: Payer: Self-pay

## 2022-04-01 ENCOUNTER — Other Ambulatory Visit: Payer: Self-pay | Admitting: Nurse Practitioner

## 2022-04-01 NOTE — Telephone Encounter (Signed)
Patient call received due to mirtazapine medication. Patient did not want this medication , she would like to have an prescription for an additional xanax per day for to aid with sleep. She states is already on something for depression ( zoloft), trazadone does not help her and she took [place] for 20 yrs and it worked great for her, she would really like to speak with provider directly if possible. Please advise

## 2022-06-13 ENCOUNTER — Telehealth: Payer: Self-pay | Admitting: Family Medicine

## 2022-06-13 ENCOUNTER — Other Ambulatory Visit: Payer: Self-pay | Admitting: Family Medicine

## 2022-06-13 MED ORDER — HYDROCODONE-ACETAMINOPHEN 5-325 MG PO TABS: 1.0000 | ORAL_TABLET | Freq: Four times a day (QID) | ORAL | 0 refills | Status: DC | PRN

## 2022-06-13 NOTE — Telephone Encounter (Signed)
Cook, Jayce G, DO     Rx refilled.   

## 2022-06-13 NOTE — Telephone Encounter (Signed)
Refill on HYDROcodone-acetaminophen (NORCO/VICODIN) 5-325 MG tablet  [facility] [place]

## 2022-06-16 ENCOUNTER — Other Ambulatory Visit: Payer: Self-pay | Admitting: Family Medicine

## 2022-06-17 ENCOUNTER — Other Ambulatory Visit: Payer: Self-pay | Admitting: Family Medicine

## 2022-06-17 ENCOUNTER — Ambulatory Visit (INDEPENDENT_AMBULATORY_CARE_PROVIDER_SITE_OTHER): Payer: Medicare Other | Admitting: Nurse Practitioner

## 2022-06-17 VITALS — BP 136/82 | Ht 64.0 in | Wt 128.8 lb

## 2022-06-17 DIAGNOSIS — F418 Other specified anxiety disorders: Secondary | ICD-10-CM

## 2022-06-17 DIAGNOSIS — Z85810 Personal history of malignant neoplasm of tongue: Secondary | ICD-10-CM

## 2022-06-17 DIAGNOSIS — G8929 Other chronic pain: Secondary | ICD-10-CM

## 2022-06-17 DIAGNOSIS — Z79891 Long term (current) use of opiate analgesic: Secondary | ICD-10-CM

## 2022-06-17 MED ORDER — MIRTAZAPINE 15 MG PO TBDP: 15.0000 mg | ORAL_TABLET | Freq: Every day | ORAL | 2 refills | Status: DC

## 2022-06-17 NOTE — Telephone Encounter (Signed)
Addressed during visit on 06/17/22 

## 2022-06-17 NOTE — Telephone Encounter (Signed)
Addressed during visit on 06/17/22

## 2022-06-17 NOTE — Progress Notes (Unsigned)
Subjective:    Patient ID: Cassandra Oliver, female    DOB: April 13, 1950, 72 y.o.   MRN: [id number]  HPI This patient was seen today for chronic pain  The medication list was reviewed and updated.   Location of Pain for which the patient has been treated with regarding narcotics: mouth and tongue  Onset of this pain: yes   -Compliance with medication: yes  - Number patient states they take daily: 4 a day as needed  -when was the last dose patient took? today  The patient was advised the importance of maintaining medication and not using illegal substances with these.  Here for refills and follow up  The patient was educated that we can provide 3 monthly scripts for their medication, it is their responsibility to follow the instructions.  Side effects or complications from medications: none  Patient is aware that pain medications are meant to minimize the severity of the pain to allow their pain levels to improve to allow for better function. They are aware of that pain medications cannot totally remove their pain.  Patient has chronic pain related to her mouth cancer and radiation.  Also has very poor dentition which she is unable to do anything about according to what she was told by her oncologist.  States the bones in her jaw are fragile and was advised not to have any further teeth removed.  Wears a bridge on top.  Continues to have difficulty with certain textures.  Is doing well with her supplements including an organic whole food beverage which she takes on a regular basis.  Continues to have great difficulty with sleep.  Requesting another Xanax or another pain pill.  Stopped her Zoloft due to burning on her tongue when she was trying to swallow the pills even when cut in half.  States she did not notice any significant relief of her anxiety.  Is in a very difficult situation where all of her retirement funds are gone pain for long-term care for her husband who has dementia.     06/17/2022    1:50 PM 12/17/2021    2:24 PM 12/17/2021    1:41 PM 09/11/2020    1:48 PM  GAD 7 : Generalized Anxiety Score  Nervous, Anxious, on Edge 0 2 2 3   Control/stop worrying 2 2 2 3   Worry too much - different things 2 2 2 3   Trouble relaxing 0 1 1 1   Restless 0 0 0 1  Easily annoyed or irritable 0 0 0 0  Afraid - awful might happen 0 1 1 1   Total GAD 7 Score 4 8 8 12   Anxiety Difficulty Not difficult at all Not difficult at all Not difficult at all Somewhat difficult       06/17/2022    1:49 PM  Depression screen PHQ 2/9  Decreased Interest 0  Down, Depressed, Hopeless 0  PHQ - 2 Score 0  Altered sleeping 3  Tired, decreased energy 1  Change in appetite 0  Feeling bad or failure about yourself  0  Trouble concentrating 0  Moving slowly or fidgety/restless 0  Suicidal thoughts 0  PHQ-9 Score 4  Difficult doing work/chores Not difficult at all         Review of Systems  Constitutional:  Positive for fatigue.  Respiratory:  Negative for chest tightness, shortness of breath and wheezing.   Cardiovascular:  Negative for chest pain and palpitations.  Psychiatric/Behavioral:  Positive for sleep disturbance. Negative for suicidal  ideas. The patient is nervous/anxious.        Objective:   Physical Exam NAD.  Alert, oriented.  Calm affect.  Making good eye contact.  Dressed appropriately for the weather.  Speech clear.  Thoughts logical coherent and relevant.  Very poor dentition with significant decay of her teeth on her lower mouth.  Lungs clear.  Heart regular rate rhythm. Today's Vitals   06/17/22 1345  BP: 136/82  Weight: 128 lb 12.8 oz (58.4 kg)  Height: 5\' 4"  (1.626 m)   Body mass index is 22.11 kg/m.        Assessment & Plan:   Problem List Items Addressed This Visit       Other   Anxiety with depression   Relevant Medications   mirtazapine (REMERON SOL-TAB) 15 MG disintegrating tablet   Chronic pain   Relevant Medications   mirtazapine  (REMERON SOL-TAB) 15 MG disintegrating tablet   HYDROcodone-acetaminophen (NORCO/VICODIN) 5-325 MG tablet   HYDROcodone-acetaminophen (NORCO/VICODIN) 5-325 MG tablet   HYDROcodone-acetaminophen (NORCO/VICODIN) 5-325 MG tablet   Encounter for long-term opiate analgesic use - Primary   History of tongue cancer   Meds ordered this encounter  Medications   mirtazapine (REMERON SOL-TAB) 15 MG disintegrating tablet    Sig: Take 1 tablet (15 mg total) by mouth at bedtime.    Dispense:  30 tablet    Refill:  2    Order Specific Question:   Supervising Provider    Answer:   Lilyan Punt A [[id number]]   HYDROcodone-acetaminophen (NORCO/VICODIN) 5-325 MG tablet    Sig: Take one tab po QID prn severe pain    Dispense:  120 tablet    Refill:  0    May fill 30 days from 06/13/22    Order Specific Question:   Supervising Provider    Answer:   Lilyan Punt A [[id number]]   HYDROcodone-acetaminophen (NORCO/VICODIN) 5-325 MG tablet    Sig: Take one tab po QID prn severe pain    Dispense:  120 tablet    Refill:  0    May fill 60 days from 06/13/22    Order Specific Question:   Supervising Provider    Answer:   Lilyan Punt A [[id number]]   HYDROcodone-acetaminophen (NORCO/VICODIN) 5-325 MG tablet    Sig: Take 1 tablet by mouth 4 (four) times daily as needed for severe pain.    Dispense:  120 tablet    Refill:  0    May fill 90 days from 06/13/22    Order Specific Question:   Supervising Provider    Answer:   Lilyan Punt A [[id number]]   Patient advised that we cannot increase her alprazolam or hydrocodone due to risk for her age.  Offered referral to pain management but patient defers at this time. After lengthy discussion, patient agrees to try mirtazapine 15 mg dissolvable tablet start with half tab p.o. nightly.  Reviewed potential adverse effects.  Discontinue medication if any problems. Strongly recommend repeating TSH based on last labs but patient defers but agrees to do this in 3 months at her next visit.   States she was just not up to it today. Continue her healthy supplements. Given information for advance care planning today.  Recommend she consider her Medicare wellness exam at her next visit. Patient defers zoster and Tdap vaccines. Return in about 3 months (around 09/16/2022).

## 2022-06-18 ENCOUNTER — Encounter: Payer: Self-pay | Admitting: Nurse Practitioner

## 2022-06-18 MED ORDER — HYDROCODONE-ACETAMINOPHEN 5-325 MG PO TABS: ORAL_TABLET | ORAL | 0 refills | Status: DC

## 2022-06-18 MED ORDER — HYDROCODONE-ACETAMINOPHEN 5-325 MG PO TABS: 1.0000 | ORAL_TABLET | Freq: Four times a day (QID) | ORAL | 0 refills | Status: DC | PRN

## 2022-07-15 ENCOUNTER — Other Ambulatory Visit: Payer: Self-pay | Admitting: Nurse Practitioner

## 2022-09-08 ENCOUNTER — Ambulatory Visit: Payer: Medicare Other | Admitting: Nurse Practitioner

## 2022-09-23 ENCOUNTER — Ambulatory Visit: Payer: Medicare Other | Admitting: Nurse Practitioner

## 2022-09-23 ENCOUNTER — Encounter: Payer: Self-pay | Admitting: Nurse Practitioner

## 2022-09-23 VITALS — BP 187/113 | HR 79 | Wt 124.2 lb

## 2022-09-23 DIAGNOSIS — R1312 Dysphagia, oropharyngeal phase: Secondary | ICD-10-CM

## 2022-09-23 DIAGNOSIS — I10 Essential (primary) hypertension: Secondary | ICD-10-CM

## 2022-09-23 DIAGNOSIS — Z79891 Long term (current) use of opiate analgesic: Secondary | ICD-10-CM

## 2022-09-23 DIAGNOSIS — G8929 Other chronic pain: Secondary | ICD-10-CM

## 2022-09-23 MED ORDER — HYDROCODONE-ACETAMINOPHEN 5-325 MG PO TABS: ORAL_TABLET | ORAL | 0 refills | Status: DC

## 2022-09-23 MED ORDER — ALPRAZOLAM 1 MG PO TABS: ORAL_TABLET | ORAL | 5 refills | Status: DC

## 2022-09-23 MED ORDER — HYDROCODONE-ACETAMINOPHEN 5-325 MG PO TABS: 1.0000 | ORAL_TABLET | Freq: Four times a day (QID) | ORAL | 0 refills | Status: DC | PRN

## 2022-09-23 NOTE — Progress Notes (Unsigned)
Subjective:    Patient ID: Cassandra Oliver, female    DOB: 05-07-1950, 72 y.o.   MRN: [id number]  HPI This patient was seen today for chronic pain  The medication list was reviewed and updated.   Location of Pain for which the patient has been treated with regarding narcotics: chronic mouth and tongue pain due to treatment for cancer; difficulty swallowing even with cutting up pills into pieces   Onset of this pain: chronic   -Compliance with medication: yes  - Number patient states they take daily: 4 per day  -Reason for ongoing use of opioids: chronic mouth and throat pain  What other measures have been tried outside of opioids None at this time; has tried multiple measures over the years  In the ongoing specialists regarding this condition: none; defers referral to pain management  -when was the last dose patient took? 3:30pm    The patient was advised the importance of maintaining medication and not using illegal substances with these.  Here for refills and follow up  The patient was educated that we can provide 3 monthly scripts for their medication, it is their responsibility to follow the instructions.  Side effects or complications from medications: None  Patient is aware that pain medications are meant to minimize the severity of the pain to allow their pain levels to improve to allow for better function. They are aware of that pain medications cannot totally remove their pain.  Due for UDT ( at least once per year) (pain management contract is also completed at the time of the UDT): obtained today  Continues to struggle with nutrition due to swallowing. Drinks at least one protein supplement per day. Then eats soft foods such as pasta. Stopped Sertraline due to burning on the tongue when she tries to swallow them even in small pieces. Many substances cause burning on the tongue. The only thing that does not is water.  Minimal socialization. Her main support is her 10  dogs she has at home. Her husband was moved to [place]. Seems to be doing well there. Their entire savings was depleted to care for him up until now. Applied for Medicaid. Cannot go to see him due to extreme anxiety. This has caused a rift with her children. Does quilting to help her anxiety. Grew up in the [employer] but does not attend.  While pain is not under good control, patient defers pain management referral which would be necessary if needs to increase medication dosing.  Adherent to other medications.   Review of Systems  Constitutional:  Positive for fatigue.  HENT:  Negative for sore throat and trouble swallowing.        Chronic mouth and tongue pain. Has lost most of her teeth which causes difficulty swallowing.   Respiratory:  Negative for cough, chest tightness and shortness of breath.   Cardiovascular:  Negative for chest pain, palpitations and leg swelling.  Psychiatric/Behavioral:  Positive for dysphoric mood and sleep disturbance. Negative for suicidal ideas. The patient is nervous/anxious.       09/23/2022    3:16 PM  Depression screen PHQ 2/9  Decreased Interest 0  Down, Depressed, Hopeless 0  PHQ - 2 Score 0  Altered sleeping 3  Tired, decreased energy 1  Change in appetite 3  Feeling bad or failure about yourself  0  Trouble concentrating 0  Moving slowly or fidgety/restless 0  Suicidal thoughts 0  PHQ-9 Score 7  Difficult doing work/chores Very  difficult      09/23/2022    3:16 PM 06/17/2022    1:50 PM 12/17/2021    2:24 PM 12/17/2021    1:41 PM  GAD 7 : Generalized Anxiety Score  Nervous, Anxious, on Edge 3 0 2 2  Control/stop worrying 3 2 2 2   Worry too much - different things 3 2 2 2   Trouble relaxing 1 0 1 1  Restless 0 0 0 0  Easily annoyed or irritable 0 0 0 0  Afraid - awful might happen 1 0 1 1  Total GAD 7 Score 11 4 8 8   Anxiety Difficulty Somewhat difficult Not difficult at all Not difficult at all Not difficult at all          Objective:   Physical Exam Constitutional:      General: She is not in acute distress. Neck:     Comments: Thyroid non tender to palpation. No mass or goiter noted.  Cardiovascular:     Rate and Rhythm: Normal rate and regular rhythm.     Heart sounds: Normal heart sounds. No murmur heard. Pulmonary:     Effort: Pulmonary effort is normal.     Breath sounds: Normal breath sounds.  Musculoskeletal:     Cervical back: Normal range of motion and neck supple.  Lymphadenopathy:     Cervical: No cervical adenopathy.  Neurological:     Mental Status: She is alert.   Mildly anxious affect; becomes very upset and crying when talking about her husband especially about visiting him. Dressed appropriately for the weather with good hygiene. Making good eye contact. Speech clear.   Today's Vitals   09/23/22 1434 09/23/22 1515  BP: (!) 172/110 (!) 187/113  Pulse: 79   SpO2: 100%   Weight: 124 lb 3.2 oz (56.3 kg)    Body mass index is 21.32 kg/m.         Assessment & Plan:   Problem List Items Addressed This Visit       Cardiovascular and Mediastinum   Primary hypertension     Respiratory   Oropharyngeal dysphagia     Other   Chronic pain   Relevant Medications   HYDROcodone-acetaminophen (NORCO/VICODIN) 5-325 MG tablet   HYDROcodone-acetaminophen (NORCO/VICODIN) 5-325 MG tablet   HYDROcodone-acetaminophen (NORCO/VICODIN) 5-325 MG tablet   Encounter for long-term opiate analgesic use - Primary   Relevant Orders   ToxASSURE Select 13 (MW), Urine (Completed)   Meds ordered this encounter  Medications   ALPRAZolam (XANAX) 1 MG tablet    Sig: Take one tab po BID prn anxiety    Dispense:  60 tablet    Refill:  5    May fill monthly    Order Specific Question:   Supervising Provider    Answer:   Lilyan Punt A [[id number]]   HYDROcodone-acetaminophen (NORCO/VICODIN) 5-325 MG tablet    Sig: Take one tab po QID prn severe pain    Dispense:  120 tablet    Refill:   0    May fill 30 days from 09/14/22    Order Specific Question:   Supervising Provider    Answer:   Lilyan Punt A [[id number]]   HYDROcodone-acetaminophen (NORCO/VICODIN) 5-325 MG tablet    Sig: Take one tab po QID prn severe pain    Dispense:  120 tablet    Refill:  0    May fill 60 days from 09/14/22    Order Specific Question:   Supervising Provider    Answer:  Lilyan Punt A [[id number]]   HYDROcodone-acetaminophen (NORCO/VICODIN) 5-325 MG tablet    Sig: Take 1 tablet by mouth 4 (four) times daily as needed for severe pain.    Dispense:  120 tablet    Refill:  0    May fill 90 days from 09/14/22    Order Specific Question:   Supervising Provider    Answer:   Lilyan Punt A [[id number]]   Continue current medications as directed.  Discussed importance of socialization and stress reduction.  Defers labs including recheck of TSH. Discussed risks associated with too much thyroid medicine particularly A fib.  Defers all preventive health recommendations for age.  Continue liquid and soft foods. Recommend daily chewable MVI but she states that burns her mouth.  Defers alternative medication to Sertraline.  Return in about 3 months (around 12/24/2022). Call back sooner if needed.

## 2022-09-24 DIAGNOSIS — R1312 Dysphagia, oropharyngeal phase: Secondary | ICD-10-CM | POA: Insufficient documentation

## 2022-10-05 ENCOUNTER — Other Ambulatory Visit: Payer: Self-pay | Admitting: Nurse Practitioner

## 2022-10-17 ENCOUNTER — Other Ambulatory Visit: Payer: Self-pay | Admitting: Nurse Practitioner

## 2022-10-17 ENCOUNTER — Telehealth: Payer: Self-pay | Admitting: Nurse Practitioner

## 2022-10-17 MED ORDER — HYDROCODONE-ACETAMINOPHEN 10-325 MG PO TABS: ORAL_TABLET | ORAL | 0 refills | Status: DC

## 2022-10-17 NOTE — Telephone Encounter (Signed)
Patient states pharmacy is out of the hydrocodone  5mg  can you write a one time usage for 10 mg tablets ,she states this was done once before when pharmacy had out of stock of medications. Please advise

## 2022-10-17 NOTE — Telephone Encounter (Signed)
Done

## 2022-10-25 ENCOUNTER — Other Ambulatory Visit: Payer: Self-pay | Admitting: Family Medicine

## 2022-11-21 ENCOUNTER — Other Ambulatory Visit: Payer: Self-pay | Admitting: Family Medicine

## 2022-12-23 ENCOUNTER — Ambulatory Visit: Payer: Medicare Other | Admitting: Nurse Practitioner

## 2022-12-28 ENCOUNTER — Ambulatory Visit (INDEPENDENT_AMBULATORY_CARE_PROVIDER_SITE_OTHER): Payer: Medicare Other | Admitting: Nurse Practitioner

## 2022-12-28 VITALS — BP 114/77 | HR 65 | Temp 97.8°F | Ht 64.0 in | Wt 122.0 lb

## 2022-12-28 DIAGNOSIS — F418 Other specified anxiety disorders: Secondary | ICD-10-CM | POA: Diagnosis not present

## 2022-12-28 DIAGNOSIS — E039 Hypothyroidism, unspecified: Secondary | ICD-10-CM | POA: Diagnosis not present

## 2022-12-28 DIAGNOSIS — Z85810 Personal history of malignant neoplasm of tongue: Secondary | ICD-10-CM

## 2022-12-28 DIAGNOSIS — Z79891 Long term (current) use of opiate analgesic: Secondary | ICD-10-CM | POA: Diagnosis not present

## 2022-12-28 MED ORDER — HYDROCODONE-ACETAMINOPHEN 5-325 MG PO TABS: ORAL_TABLET | ORAL | 0 refills | Status: DC

## 2022-12-28 NOTE — Progress Notes (Signed)
Subjective:    Patient ID: Cassandra Oliver, female    DOB: 1950/10/20, 72 y.o.   MRN: [id number]  HPI This patient was seen today for chronic pain  The medication list was reviewed and updated.   Location of Pain for which the patient has been treated with regarding narcotics: chronic mouth and tongue pain due to treatment for cancer; difficulty swallowing even with cutting up pills into pieces; has great difficulty eating as well  Onset of this pain: chronic   -Compliance with medication: yes  - Number patient states they take daily: 4 per day  -Reason for ongoing use of opioids: chronic mouth and throat pain  What other measures have been tried outside of opioids: none at this time; has tried multiple measures over the years; problem is chronic due to treatment and surgery due to cancer  In the ongoing specialists regarding this condition: none  -when was the last dose patient took? Has taken 2 doses today; seen in the afternoon  The patient was advised the importance of maintaining medication and not using illegal substances with these.  Here for refills and follow up  The patient was educated that we can provide 3 monthly scripts for their medication, it is their responsibility to follow the instructions.  Side effects or complications from medications: None  Patient is aware that pain medications are meant to minimize the severity of the pain to allow their pain levels to improve to allow for better function. They are aware of that pain medications cannot totally remove their pain.  Due for UDT ( at least once per year) (pain management contract is also completed at the time of the UDT): UTD Has been doing slightly better mentally lately. Still has her pets which provide great comfort.  While pain is not under good control, patient defers pain management referral which would be necessary if needs to increase medication dosing.  Adherent to other medications.  Xanax helps  some with her anxiety and sleep. Is taking naps during the day if she does not rest well. Stopped her Sertraline at last visit.     12/28/2022   11:06 AM  Depression screen PHQ 2/9  Decreased Interest 1  Down, Depressed, Hopeless 1  PHQ - 2 Score 2  Altered sleeping 3  Tired, decreased energy 1  Change in appetite 3  Feeling bad or failure about yourself  0  Trouble concentrating 0  Moving slowly or fidgety/restless 0  Suicidal thoughts 0  PHQ-9 Score 9  Difficult doing work/chores Not difficult at all      12/28/2022   11:06 AM 09/23/2022    3:16 PM 06/17/2022    1:50 PM 12/17/2021    2:24 PM  GAD 7 : Generalized Anxiety Score  Nervous, Anxious, on Edge  3 0 2  Control/stop worrying 1 3 2 2   Worry too much - different things 1 3 2 2   Trouble relaxing 0 1 0 1  Restless 1 0 0 0  Easily annoyed or irritable 0 0 0 0  Afraid - awful might happen 0 1 0 1  Total GAD 7 Score  11 4 8   Anxiety Difficulty Not difficult at all Somewhat difficult Not difficult at all Not difficult at all     Review of Systems  Constitutional:  Positive for fatigue. Negative for unexpected weight change.  HENT:  Positive for trouble swallowing.   Respiratory:  Negative for cough, chest tightness, shortness of breath and wheezing.  Cardiovascular:  Negative for chest pain.  Psychiatric/Behavioral:  Positive for dysphoric mood and sleep disturbance. Negative for suicidal ideas. The patient is nervous/anxious.        Objective:   Physical Exam NAD.  Alert, oriented.  Calm cheerful affect.  Making good eye contact.  Dressed appropriately for the weather.  Speech clear.  Thoughts logical coherent and relevant.  Normal mood and behavior.  Lungs clear.  Heart regular rate rhythm.  Patient much more cheerful and calm from previous visit. Today's Vitals   12/28/22 1057 12/28/22 1143  BP: (!) 155/92 114/77  Pulse: 65   Temp: 97.8 F (36.6 C)   SpO2: 96%   Weight: 122 lb (55.3 kg)   Height: 5\' 4"  (1.626  m)    Body mass index is 20.94 kg/m.  Weight is stable.      Assessment & Plan:   Problem List Items Addressed This Visit       Endocrine   Hypothyroidism   Relevant Orders   TSH (Completed)   T4, free (Completed)     Other   Anxiety with depression   Encounter for long-term opiate analgesic use - Primary   History of tongue cancer   Meds ordered this encounter  Medications   HYDROcodone-acetaminophen (NORCO/VICODIN) 5-325 MG tablet    Sig: Take one tab po QID prn severe pain    Dispense:  120 tablet    Refill:  0    May fill 60 days from 12/26/22    Order Specific Question:   Supervising Provider    Answer:   Lilyan Punt A [[id number]]   HYDROcodone-acetaminophen (NORCO/VICODIN) 5-325 MG tablet    Sig: Take one tab po QID prn severe pain    Dispense:  120 tablet    Refill:  0    May fill 90 days from 12/26/22    Order Specific Question:   Supervising Provider    Answer:   Lilyan Punt A [[id number]]   HYDROcodone-acetaminophen (NORCO/VICODIN) 5-325 MG tablet    Sig: Take one tab po QID prn severe pain    Dispense:  120 tablet    Refill:  0    May fill 30 days from 12/26/22    Order Specific Question:   Supervising Provider    Answer:   Lilyan Punt A [[id number]]   Patient agrees to get thyroid test done today.  Defers all other labs.  Also defers all preventive health testing. Continue pain medication at current dose.  Continue supplements and soft foods for nutrition. Return in about 3 months (around 03/30/2023) for Pain management.

## 2022-12-29 LAB — T4, FREE: Free T4: 1.24 ng/dL (ref 0.82–1.77)

## 2022-12-29 LAB — TSH: TSH: 0.67 u[IU]/mL (ref 0.450–4.500)

## 2022-12-30 ENCOUNTER — Encounter: Payer: Self-pay | Admitting: Nurse Practitioner

## 2023-03-27 ENCOUNTER — Other Ambulatory Visit: Payer: Self-pay | Admitting: Nurse Practitioner

## 2023-04-06 ENCOUNTER — Ambulatory Visit: Payer: Medicare Other | Admitting: Nurse Practitioner

## 2023-04-06 VITALS — BP 197/110 | HR 71 | Temp 97.2°F | Ht 64.0 in | Wt 125.0 lb

## 2023-04-06 DIAGNOSIS — I10 Essential (primary) hypertension: Secondary | ICD-10-CM

## 2023-04-06 DIAGNOSIS — R1312 Dysphagia, oropharyngeal phase: Secondary | ICD-10-CM

## 2023-04-06 DIAGNOSIS — G8929 Other chronic pain: Secondary | ICD-10-CM

## 2023-04-06 DIAGNOSIS — Z79891 Long term (current) use of opiate analgesic: Secondary | ICD-10-CM | POA: Diagnosis not present

## 2023-04-06 DIAGNOSIS — F418 Other specified anxiety disorders: Secondary | ICD-10-CM | POA: Diagnosis not present

## 2023-04-06 DIAGNOSIS — Z604 Social exclusion and rejection: Secondary | ICD-10-CM

## 2023-04-06 DIAGNOSIS — Z85810 Personal history of malignant neoplasm of tongue: Secondary | ICD-10-CM | POA: Diagnosis not present

## 2023-04-06 MED ORDER — HYDROCODONE-ACETAMINOPHEN 5-325 MG PO TABS: ORAL_TABLET | ORAL | 0 refills | Status: DC

## 2023-04-06 NOTE — Progress Notes (Signed)
Subjective:    Patient ID: Cassandra Oliver, female    DOB: June 07, 1950, 73 y.o.   MRN: [id number]  HPI This patient was seen today for chronic pain  The medication list was reviewed and updated.   Location of Pain for which the patient has been treated with regarding narcotics: Severe pain in her mouth and tongue due to previous surgery and radiation.  In addition patient has severe pain on the left side of the face and into the neck from radiation and surgery.  Onset of this pain: Chronic   -Compliance with medication: yes  - Number patient states they take daily: For pills  -Reason for ongoing use of opioids severe ongoing pain from previous cancer treatment  What other measures have been tried outside of opioids has tried multiple medications over the years with no improvement.  In the ongoing specialists regarding this condition none, patient defers referrals  -when was the last dose patient took?  This morning  The patient was advised the importance of maintaining medication and not using illegal substances with these.  Here for refills and follow up  The patient was educated that we can provide 3 monthly scripts for their medication, it is their responsibility to follow the instructions.  Side effects or complications from medications: None  Patient is aware that pain medications are meant to minimize the severity of the pain to allow their pain levels to improve to allow for better function. They are aware of that pain medications cannot totally remove their pain.  Due for UDT ( at least once per year) (pain management contract is also completed at the time of the UDT): 09/23/2022  Scale of 1 to 10 ( 1 is least 10 is most) Your pain level without the medicine: 10 Your pain level with medication 7 (just makes the pain tolerable)  Scale 1 to 10 ( 1-helps very little, 10 helps very well) How well does your pain medication reduce your pain so you can function better through  out the day?  Patient is able to do her ADLs and function on her own  Patient takes hydrocodone 5/325 1 first thing in the morning, 1 at 11:00, and around 3 PM will take 1-2 tablets depending on her level of pain which is usually severe at that time.  Does not take her alprazolam within 4 hours of taking pain medication.  Her current regimen is not working in the evening and nighttime, has pain severe enough to make her start crying.  States she was on 5 pills/day for a long time but this was reduced at 1 point.  Very socially isolated.  Has 2 sons and grandchildren who live out of state and at this time is minimal communication due to some family issues.  Did have a conversation with her 82-year-old granddaughter the other night which she really enjoyed.  States she spoke to her for about 2 hours. Progressive difficulty with eating and swallowing.  Most foods cause pain and burning on her tongue.  Any type of protein she chews gets stuck on her tongue.  Difficulty with many beverages due to burning pain.  At this time mainly drinks water.  Mostly soft foods and soup.  Has had a swallowing test in the past after her treatment.  Difficulty swallowing at times, denies any choking.  Has to be very careful about her medications and foods.  At this time defers referral to GI/ENT specialist for evaluation of her swallowing. Patient lives alone  with her dogs.  Denies any suicidal plan or intent.  Has struggled greatly lately with depression.  Takes her alprazolam half to 1 tab as needed for anxiety but near the time of her pain medicine. States her BP at home runs 122-135/76-88.  Patient has her BP record with her in the office.  Also makes notes about her diet.   Review of Systems  HENT:  Positive for trouble swallowing.   Respiratory:  Negative for cough, chest tightness, shortness of breath and wheezing.   Cardiovascular:  Negative for chest pain and palpitations.  Gastrointestinal:  Positive for nausea.  Negative for vomiting.       Objective:   Physical Exam NAD.  Alert, oriented.  Tearful at times especially when discussing her level of pain or her family issues.  Making good eye contact.  Speech clear.  Normal judgment and behavior.  Dressed appropriately for the weather.  Thoughts logical coherent and relevant.  Speech slightly affected by partial tongue removal.  There is scarring on the left side of the neck from her previous surgery.  Patient has lost many of her teeth.  Lungs clear.  Heart regular rate rhythm. Today's Vitals   04/06/23 1315  BP: (!) 197/110  Pulse: 71  Temp: (!) 97.2 F (36.2 C)  SpO2: 98%  Weight: 125 lb (56.7 kg)  Height: 5\' 4"  (1.626 m)   Body mass index is 21.46 kg/m.       Assessment & Plan:   Problem List Items Addressed This Visit       Cardiovascular and Mediastinum   Primary hypertension     Respiratory   Oropharyngeal dysphagia     Other   Anxiety with depression   Chronic pain   Relevant Medications   HYDROcodone-acetaminophen (NORCO/VICODIN) 5-325 MG tablet   HYDROcodone-acetaminophen (NORCO/VICODIN) 5-325 MG tablet   HYDROcodone-acetaminophen (NORCO/VICODIN) 5-325 MG tablet   Encounter for long-term opiate analgesic use - Primary   History of tongue cancer   Social isolation   Meds ordered this encounter  Medications   HYDROcodone-acetaminophen (NORCO/VICODIN) 5-325 MG tablet    Sig: Take one tab po every 4 hours prn severe pain; max 5 tabs per 24 hours    Dispense:  150 tablet    Refill:  0    May fill 60 days from 03/27/23; note change in dosing    Supervising Provider:   Lilyan Punt A [[id number]]   HYDROcodone-acetaminophen (NORCO/VICODIN) 5-325 MG tablet    Sig: Take one tab po every 4 hours prn severe pain; max 5 tabs per 24 hours    Dispense:  150 tablet    Refill:  0    May fill 90 days from 03/27/23; note new dosing    Supervising Provider:   Lilyan Punt A [[id number]]   HYDROcodone-acetaminophen (NORCO/VICODIN) 5-325 MG  tablet    Sig: Take one tab po every 4 hours prn severe pain; max 5 per 24 hours    Dispense:  150 tablet    Refill:  0    May fill 30 days from 03/27/23; note new dosing    Supervising Provider:   Lilyan Punt A [[id number]]   Lengthy discussion regarding her pain control.  At this time we will increase her hydrocodone 5/325 to every 4 hours with a maximum dose of 5 tablets per 24 hours.  Also advised patient not to take her alprazolam within 4 hours of taking her pain medication.  Patient agrees with this plan.  Understands that no  further increases will be done in her pain medication without a referral to pain management which she defers at this point.  Discussed risks associated with benzodiazepine and pain medication. Strongly encourage patient to increase social interactions.  States she grew up in the [employer] but has no plans to return. Defers all preventive health recommendations. Return in about 3 months (around 07/04/2023).

## 2023-04-08 ENCOUNTER — Encounter: Payer: Self-pay | Admitting: Nurse Practitioner

## 2023-04-08 DIAGNOSIS — Z604 Social exclusion and rejection: Secondary | ICD-10-CM | POA: Insufficient documentation

## 2023-04-26 ENCOUNTER — Other Ambulatory Visit: Payer: Self-pay | Admitting: Family Medicine

## 2023-04-26 ENCOUNTER — Other Ambulatory Visit: Payer: Self-pay | Admitting: Nurse Practitioner

## 2023-04-27 ENCOUNTER — Telehealth: Payer: Self-pay | Admitting: Family Medicine

## 2023-04-27 NOTE — Telephone Encounter (Signed)
 Copied from CRM [id number]. Topic: Clinical - Prescription Issue >> Apr 27, 2023  9:34 AM Izetta Dakin wrote: Reason for CRM: Patient states that a hold was placed on pain med, HYDROcodone-acetaminophen (NORCO/VICODIN) 5-325 MG tablet. Patient is requesting that hold be released and that pharmacy be contacted to have medication released. Patient requesting a callback at [phone].  Pharmacy: [facility] in [place]

## 2023-04-27 NOTE — Telephone Encounter (Signed)
 Pharmacy stated they don't know how it was blocked - it was an error on their end and they and they fixed it and it will be ready for pick up this afternoon- patient notified

## 2023-05-27 ENCOUNTER — Other Ambulatory Visit: Payer: Self-pay | Admitting: Nurse Practitioner

## 2023-05-29 ENCOUNTER — Telehealth: Payer: Self-pay | Admitting: Family Medicine

## 2023-05-29 NOTE — Telephone Encounter (Signed)
 Copied from CRM [id number]. Topic: Clinical - Medication Refill >> May 29, 2023 10:21 AM Nyra Capes wrote: Patient calling in requesting medication refill.   Most Recent Primary Care Visit:  Provider: Campbell Riches  Department: [facility]  Visit Type: OFFICE VISIT  Date: 04/06/2023  Medication: ALPRAZolam Prudy Feeler) 1 MG tablet  Has the patient contacted their pharmacy? Yes (Agent: If no, request that the patient contact the pharmacy for the refill. If patient does not wish to contact the pharmacy document the reason why and proceed with request.) (Agent: If yes, when and what did the pharmacy advise?)  Is this the correct pharmacy for this prescription? Yes If no, delete pharmacy and type the correct one.  This is the patient's preferred pharmacy:   [id number] - [place], [place] - [address] AT [facility] [address] [place] [address] Phone: [phone] Fax: [phone]   Has the prescription been filled recently? Yes  Is the patient out of the medication? Yes  Has the patient been seen for an appointment in the last year OR does the patient have an upcoming appointment? Yes  Can we respond through MyChart? No  Agent: Please be advised that Rx refills may take up to 3 business days. We ask that you follow-up with your pharmacy.  Patient phone # [phone]

## 2023-06-26 ENCOUNTER — Other Ambulatory Visit: Payer: Self-pay | Admitting: Nurse Practitioner

## 2023-06-26 NOTE — Telephone Encounter (Signed)
 Last Fill: Norco: 04/06/23 x 3     Xanax : 05/30/23  Last OV: 04/06/23 Next OV: 07/04/23  Routing to provider for review/authorization.

## 2023-06-26 NOTE — Telephone Encounter (Addendum)
 Copied from CRM [id number]. Topic: Clinical - Medication Refill >> Jun 26, 2023  3:22 PM Rennis Case wrote: Most Recent Primary Care Visit:  Provider: HOSKINS, CAROLYN C  Department: [facility]  Visit Type: OFFICE VISIT  Date: 04/06/2023  Medication: HYDROcodone -acetaminophen  (NORCO/VICODIN) 5-325 MG tablet ALPRAZolam  (XANAX ) 1 MG tablet Patient requesting rx to be sent in, pt is upset as she has been having trouble getting rx filled. Patient requesting call back about rx refill request, [phone].  Has the patient contacted their pharmacy? Yes Patient told to contact office as their is a hold on the hydrocodone  rx.   Is this the correct pharmacy for this prescription? Yes This is the patient's preferred pharmacy:  [id number] - [place], [place] - [address] AT [facility] [address]  [place] [address] Phone: [phone] Fax: [phone]   Has the prescription been filled recently? No  Is the patient out of the medication? No, will be out tomorrow.  Has the patient been seen for an appointment in the last year OR does the patient have an upcoming appointment? Yes  Can we respond through MyChart? No  Agent: Please be advised that Rx refills may take up to 3 business days. We ask that you follow-up with your pharmacy.

## 2023-06-27 MED ORDER — HYDROCODONE-ACETAMINOPHEN 5-325 MG PO TABS: ORAL_TABLET | ORAL | 0 refills | Status: DC

## 2023-07-04 ENCOUNTER — Ambulatory Visit: Payer: Medicare Other | Admitting: Nurse Practitioner

## 2023-07-10 ENCOUNTER — Encounter: Payer: Self-pay | Admitting: Nurse Practitioner

## 2023-07-10 ENCOUNTER — Ambulatory Visit (INDEPENDENT_AMBULATORY_CARE_PROVIDER_SITE_OTHER): Admitting: Nurse Practitioner

## 2023-07-10 VITALS — BP 130/80 | HR 68 | Temp 97.3°F | Ht 64.0 in | Wt 115.0 lb

## 2023-07-10 DIAGNOSIS — F418 Other specified anxiety disorders: Secondary | ICD-10-CM

## 2023-07-10 DIAGNOSIS — Z85810 Personal history of malignant neoplasm of tongue: Secondary | ICD-10-CM | POA: Diagnosis not present

## 2023-07-10 DIAGNOSIS — Z79891 Long term (current) use of opiate analgesic: Secondary | ICD-10-CM

## 2023-07-10 DIAGNOSIS — R1312 Dysphagia, oropharyngeal phase: Secondary | ICD-10-CM

## 2023-07-10 DIAGNOSIS — R6889 Other general symptoms and signs: Secondary | ICD-10-CM | POA: Diagnosis not present

## 2023-07-10 DIAGNOSIS — G4700 Insomnia, unspecified: Secondary | ICD-10-CM | POA: Diagnosis not present

## 2023-07-10 MED ORDER — HYDROCODONE-ACETAMINOPHEN 5-325 MG PO TABS: ORAL_TABLET | ORAL | 0 refills | Status: DC

## 2023-07-10 MED ORDER — ALPRAZOLAM 1 MG PO TABS: ORAL_TABLET | ORAL | 2 refills | Status: DC

## 2023-07-10 NOTE — Progress Notes (Signed)
 Subjective:    Patient ID: Cassandra Oliver, female    DOB: 12-19-1950, 73 y.o.   MRN: [id number]  HPI This patient was seen today for chronic pain  The medication list was reviewed and updated.   Location of Pain for which the patient has been treated with regarding narcotics: Severe pain in her mouth and tongue due to previous surgery and radiation. In addition patient has severe pain on the left side of the face and into the neck from radiation and surgery.   Onset of this pain: chronic   -Compliance with medication: yes  - Number patient states they take daily: Max 5 pills per day  -Reason for ongoing use of opioids: severe ongoing pain from surgery and radiation related to tongue and mouth cancer  What other measures have been tried outside of opioids: multiple medications over the years  In the ongoing specialists regarding this condition: none, defers referrals  -when was the last dose patient took? This am  The patient was advised the importance of maintaining medication and not using illegal substances with these.  Here for refills and follow up  The patient was educated that we can provide 3 monthly scripts for their medication, it is their responsibility to follow the instructions.  Side effects or complications from medications: none  Patient is aware that pain medications are meant to minimize the severity of the pain to allow their pain levels to improve to allow for better function. They are aware of that pain medications cannot totally remove their pain.  Due for UDT ( at least once per year) (pain management contract is also completed at the time of the UDT): 09/23/2022  Scale of 1 to 10 ( 1 is least 10 is most) Your pain level without the medicine: 10 Your pain level with medication 7  Scale 1 to 10 ( 1-helps very little, 10 helps very well) How well does your pain medication reduce your pain so you can function better through out the day? Allows patient to  perform ADLs and provide self care; also take care of her pets  Patient states her pain is under much better control with changes in dosing at her last visit.  Does not take her alprazolam  with her pain medication.  Continues to be very socially isolated.  At one time was doing quilting which she states she is not interested in anymore.  States none of the foods she eats tastes good and many of them hurt her mouth.  Has to do very tiny amounts of food and cut her pills into small pieces to be able to swallow them.  States this is a result of her surgery and radiation.  Has been unable to drink any protein supplements due to taste, states she cannot tolerate them.  Also has no appetite.  This has been a long-term problem, see previous notes.   Review of Systems  Constitutional:  Positive for unexpected weight change.  HENT:  Positive for trouble swallowing.   Respiratory:  Negative for cough, shortness of breath and wheezing.   Cardiovascular:  Negative for chest pain.  Musculoskeletal:        Chronic mouth and tongue pain. Also pain on the left side of the face from surgery.    Social History   Tobacco Use   Smoking status: Never   Smokeless tobacco: Never  Substance Use Topics   Alcohol use: No   Drug use: No        Objective:  Physical Exam NAD.  Alert, oriented.  Calm affect.  Lungs clear.  Heart regular rate rhythm.  Most of her dentition is missing at this point.  Speech is clear.  Making good eye contact.  Thoughts logical coherent and relevant. Today's Vitals   07/10/23 1407  BP: 130/80  Pulse: 68  Temp: (!) 97.3 F (36.3 C)  SpO2: 98%  Weight: 115 lb (52.2 kg)  Height: 5\' 4"  (1.626 m)   Body mass index is 19.74 kg/m.  Has had 10 pound weight loss since visit 3 months ago.       Assessment & Plan:   Problem List Items Addressed This Visit       Respiratory   Oropharyngeal dysphagia     Other   Anxiety with depression   Relevant Medications   ALPRAZolam   (XANAX ) 1 MG tablet   Encounter for long-term opiate analgesic use - Primary   History of tongue cancer   Insomnia   Unintentional weight change   Meds ordered this encounter  Medications   ALPRAZolam  (XANAX ) 1 MG tablet    Sig: TAKE 1 TABLET BY MOUTH TWICE A DAY AS NEEDED FOR ANXIETY    Dispense:  60 tablet    Refill:  2    May fill 30 days from 06/29/23    Supervising Provider:   Charlotta Cook A [[id number]]   HYDROcodone -acetaminophen  (NORCO/VICODIN) 5-325 MG tablet    Sig: Take one tab po every 4 hours prn severe pain; max 5 tabs per 24 hours    Dispense:  150 tablet    Refill:  0    May fill 90 days from 06/27/23    Supervising Provider:   Charlotta Cook A [[id number]]   HYDROcodone -acetaminophen  (NORCO/VICODIN) 5-325 MG tablet    Sig: Take one tab po every 4 hours prn severe pain; max 5 per 24 hours    Dispense:  150 tablet    Refill:  0    May fill 30 days from 06/27/23    Supervising Provider:   Charlotta Cook A [[id number]]   HYDROcodone -acetaminophen  (NORCO/VICODIN) 5-325 MG tablet    Sig: Take one tab po every 4 hours prn severe pain; max 5 tabs per 24 hours    Dispense:  150 tablet    Refill:  0    May fill 60 days from 06/27/23    Supervising Provider:   Charlotta Cook A [phone]   Continue current medication regimen as directed.  Lengthy discussion regarding her nutrition and appetite.  Patient adamantly refuses to consider G-tube for tube feeding.  Continue to do the best she can as far as her appetite and supplements.  Will continue to monitor her weight. Return in about 3 months (around 10/10/2023).

## 2023-07-11 ENCOUNTER — Encounter: Payer: Self-pay | Admitting: Nurse Practitioner

## 2023-10-10 ENCOUNTER — Ambulatory Visit: Admitting: Nurse Practitioner

## 2023-10-10 ENCOUNTER — Encounter: Payer: Self-pay | Admitting: Nurse Practitioner

## 2023-10-10 VITALS — BP 142/84 | HR 81 | Temp 97.7°F | Ht 64.0 in | Wt 112.0 lb

## 2023-10-10 DIAGNOSIS — I10 Essential (primary) hypertension: Secondary | ICD-10-CM | POA: Diagnosis not present

## 2023-10-10 DIAGNOSIS — R1312 Dysphagia, oropharyngeal phase: Secondary | ICD-10-CM | POA: Diagnosis not present

## 2023-10-10 DIAGNOSIS — F418 Other specified anxiety disorders: Secondary | ICD-10-CM

## 2023-10-10 DIAGNOSIS — Z85810 Personal history of malignant neoplasm of tongue: Secondary | ICD-10-CM | POA: Diagnosis not present

## 2023-10-10 DIAGNOSIS — Z79891 Long term (current) use of opiate analgesic: Secondary | ICD-10-CM

## 2023-10-10 DIAGNOSIS — G8929 Other chronic pain: Secondary | ICD-10-CM | POA: Diagnosis not present

## 2023-10-10 NOTE — Progress Notes (Unsigned)
   Subjective:    Patient ID: Cassandra Oliver, female    DOB: [birth year], 73 y.o.   MRN: [id number]  HPI This patient was seen today for chronic pain  The medication list was reviewed and updated.   Location of Pain for which the patient has been treated with regarding narcotics: mouth and tongue  Onset of this pain: ***   -Compliance with medication: ***  - Number patient states they take daily: 5 a day   -Reason for ongoing use of opioids pain  What other measures have been tried outside of opioids none   In the ongoing specialists regarding this condition ***  -when was the last dose patient took? 1pm   The patient was advised the importance of maintaining medication and not using illegal substances with these.  Here for refills and follow up  The patient was educated that we can provide 3 monthly scripts for their medication, it is their responsibility to follow the instructions.  Side effects or complications from medications: none  Patient is aware that pain medications are meant to minimize the severity of the pain to allow their pain levels to improve to allow for better function. They are aware of that pain medications cannot totally remove their pain.  Due for UDT ( at least once per year) (pain management contract is also completed at the time of the UDT): ***  Scale of 1 to 10 ( 1 is least 10 is most) Your pain level without the medicine: 10 Your pain level with medication 7  Scale 1 to 10 ( 1-helps very little, 10 helps very well) How well does your pain medication reduce your pain so you can function better through out the day? 8  Quality of the pain: ***  Persistence of the pain: ongoing   Modifying factors: talking, eating, drinking         Review of Systems     Objective:   Physical Exam        Assessment & Plan:

## 2023-10-11 MED ORDER — DULOXETINE HCL 30 MG PO CPEP: ORAL_CAPSULE | ORAL | 2 refills | Status: DC

## 2023-10-11 MED ORDER — HYDROCODONE-ACETAMINOPHEN 5-325 MG PO TABS: ORAL_TABLET | ORAL | 0 refills | Status: DC

## 2023-10-11 MED ORDER — ALPRAZOLAM 1 MG PO TABS: ORAL_TABLET | ORAL | 2 refills | Status: DC

## 2023-10-27 ENCOUNTER — Other Ambulatory Visit: Payer: Self-pay | Admitting: Nurse Practitioner

## 2023-10-27 ENCOUNTER — Other Ambulatory Visit: Payer: Self-pay | Admitting: Family Medicine

## 2024-01-04 ENCOUNTER — Other Ambulatory Visit: Payer: Self-pay | Admitting: Nurse Practitioner

## 2024-01-11 ENCOUNTER — Ambulatory Visit (INDEPENDENT_AMBULATORY_CARE_PROVIDER_SITE_OTHER): Admitting: Nurse Practitioner

## 2024-01-11 ENCOUNTER — Encounter: Payer: Self-pay | Admitting: Nurse Practitioner

## 2024-01-11 VITALS — BP 209/114 | HR 66 | Ht 64.0 in | Wt 115.0 lb

## 2024-01-11 DIAGNOSIS — F418 Other specified anxiety disorders: Secondary | ICD-10-CM

## 2024-01-11 DIAGNOSIS — Z79891 Long term (current) use of opiate analgesic: Secondary | ICD-10-CM

## 2024-01-11 DIAGNOSIS — I10 Essential (primary) hypertension: Secondary | ICD-10-CM | POA: Diagnosis not present

## 2024-01-11 DIAGNOSIS — E039 Hypothyroidism, unspecified: Secondary | ICD-10-CM | POA: Diagnosis not present

## 2024-01-11 DIAGNOSIS — G8928 Other chronic postprocedural pain: Secondary | ICD-10-CM

## 2024-01-11 NOTE — Progress Notes (Deleted)
 N

## 2024-01-11 NOTE — Progress Notes (Signed)
 Subjective:    Patient ID: Cassandra Oliver, female    DOB: [birth year], 73 y.o.   MRN: [id number]  CC: pain management  HPI: 73 year old, female, arrives for a follow up on her pain management. She takes the norco 5-325mg  as directed, taking the maximum of 5 tablets daily. Before taking the medication she rates her pain as a 8/10. If she takes 2 tablets by mouth at 8 am the pain reduces to 2/10. She does not take 2 tablets at one time daily but she does have a system and spaces her meds out appropriately; every time she eats. All of her pain are associated with eating. She continues to have mouth numbness, trouble swallowing. Preparing to eat causes increased anxiety and increased pain. Her diet consists of crushed foods and due to her cancer history she is limited on what she eats. Again defers referral to OT for swallowing. States she did this after her cancer surgery. She gets physical activity daily by walking her dogs, she has 8 currently. Her sleep is broken throughout the night and that is when she utilizes her xanax . She falls asleep around 9-12. She takes one dose of xanax  at 11am and another at 6am. She does not take pain medication with the Xanax . She never picked up the Cymbalta  due to her personal research on the side effects.   Her BP was elevated today. She states that she does not check her BP at home consistently but she does have a home BP machine and checks its sometimes. When she does check her BP it is described as fairly normal.  She is currently tearful today about her pain management regimen, the lost of her pets, her family relationships and the anxiety around her eating habits. Denies suicidal thoughts or ideation.    Review of Systems  Constitutional:  Positive for fatigue. Negative for activity change and appetite change.  HENT:  Negative for sore throat and trouble swallowing.   Respiratory:  Negative for cough, shortness of breath and wheezing.   Cardiovascular:  Negative  for chest pain.  Gastrointestinal:  Negative for abdominal pain, constipation, diarrhea, nausea and vomiting.  Neurological:  Negative for dizziness, light-headedness and headaches.  Psychiatric/Behavioral:  Positive for sleep disturbance. Negative for suicidal ideas. The patient is nervous/anxious.       01/11/2024    2:12 PM  Depression screen PHQ 2/9  Decreased Interest 1  Down, Depressed, Hopeless 0  PHQ - 2 Score 1  Altered sleeping 3  Tired, decreased energy 3  Change in appetite 3  Feeling bad or failure about yourself  0  Trouble concentrating 0  Moving slowly or fidgety/restless 0  Suicidal thoughts 0  PHQ-9 Score 10  Difficult doing work/chores Somewhat difficult      01/11/2024    2:12 PM 10/10/2023    1:40 PM 07/10/2023    2:18 PM 04/06/2023    1:22 PM  GAD 7 : Generalized Anxiety Score  Nervous, Anxious, on Edge 1 2 2  0  Control/stop worrying 2 2 2  0  Worry too much - different things 2 3 2  0  Trouble relaxing 2 2 2  0  Restless 0 0 0 0  Easily annoyed or irritable 0 0 0 0  Afraid - awful might happen 1 2 2  0  Total GAD 7 Score 8 11 10  0  Anxiety Difficulty Somewhat difficult Not difficult at all Somewhat difficult Not difficult at all    Social History   Tobacco  Use   Smoking status: Never   Smokeless tobacco: Never  Vaping Use   Vaping status: Never Used  Substance Use Topics   Alcohol use: No   Drug use: No           Objective:   Physical Exam Vitals and nursing note reviewed.  Constitutional:      General: She is not in acute distress.    Appearance: Normal appearance.  Neck:     Comments: Thyroid  palpable, non-tender, no mass/goiter noted.  Cardiovascular:     Rate and Rhythm: Normal rate and regular rhythm.     Heart sounds: Normal heart sounds.  Pulmonary:     Effort: Pulmonary effort is normal.     Breath sounds: Normal breath sounds.  Musculoskeletal:     Cervical back: Neck supple. No tenderness.  Skin:    General: Skin is  warm and dry.  Neurological:     Mental Status: She is alert and oriented to person, place, and time.  Psychiatric:        Attention and Perception: Attention and perception normal.        Mood and Affect: Mood is anxious. Affect is tearful.        Speech: Speech normal.        Behavior: Behavior normal.        Thought Content: Thought content normal.        Cognition and Memory: Cognition and memory normal.     Comments: Patient was tearful today.     Vitals:   01/11/24 1409 01/11/24 1411 01/11/24 1515  BP: (!) 205/106 Comment: with bp machine (!) 196/110 Comment: manual (!) 209/114  Pulse: 66    Height: 5' 4 (1.626 m)    Weight: 52.2 kg    SpO2: 98%    BMI (Calculated): 19.73        Assessment & Plan:  1. Anxiety with depression (Primary) Phone call to patient on 11/21. Agrees to try low dose Duloxetine  for pain and anxiety/depression.  - ALPRAZolam  (XANAX ) 1 MG tablet; TAKE 1 TABLET BY MOUTH TWICE A DAY AS NEEDED FOR ANXIETY  Dispense: 60 tablet; Refill: 2 - DULoxetine  (CYMBALTA ) 20 MG capsule; Take 1 capsule (20 mg total) by mouth daily.  Dispense: 90 capsule; Refill: 0  2. Encounter for long-term opiate analgesic use Consulted with Dr. Bluford. Spoke with patient on 11/21. Will change her hydrocodone  dosing. She agrees with this plan.  - ToxASSURE Select 13 (MW), Urine - HYDROcodone -acetaminophen  (NORCO) 10-325 MG tablet; Take 1 tab po TID prn pain  Dispense: 90 tablet; Refill: 0 - HYDROcodone -acetaminophen  (NORCO) 10-325 MG tablet; Take 1 tab po TID prn pain  Dispense: 90 tablet; Refill: 0 - HYDROcodone -acetaminophen  (NORCO) 10-325 MG tablet; Take 1 tab po TID prn pain  Dispense: 90 tablet; Refill: 0  3. Hypothyroidism, unspecified type  - TSH - T4, free  4. Primary hypertension BP elevated today. Patient to continue to check BP at home and contact office if remains elevated.   5. Other chronic postprocedural pain  - HYDROcodone -acetaminophen  (NORCO) 10-325 MG  tablet; Take 1 tab po TID prn pain  Dispense: 90 tablet; Refill: 0 - HYDROcodone -acetaminophen  (NORCO) 10-325 MG tablet; Take 1 tab po TID prn pain  Dispense: 90 tablet; Refill: 0 - HYDROcodone -acetaminophen  (NORCO) 10-325 MG tablet; Take 1 tab po TID prn pain  Dispense: 90 tablet; Refill: 0 - DULoxetine  (CYMBALTA ) 20 MG capsule; Take 1 capsule (20 mg total) by mouth daily.  Dispense: 90 capsule;  Refill: 0   Return in about 3 months (around 04/12/2024).

## 2024-01-12 ENCOUNTER — Encounter: Payer: Self-pay | Admitting: Nurse Practitioner

## 2024-01-12 ENCOUNTER — Ambulatory Visit: Payer: Self-pay | Admitting: Nurse Practitioner

## 2024-01-12 LAB — T4, FREE: Free T4: 1.31 ng/dL (ref 0.82–1.77)

## 2024-01-12 LAB — TSH: TSH: 4.83 u[IU]/mL — ABNORMAL HIGH (ref 0.450–4.500)

## 2024-01-12 MED ORDER — DULOXETINE HCL 20 MG PO CPEP: 20.0000 mg | ORAL_CAPSULE | Freq: Every day | ORAL | 0 refills | Status: AC

## 2024-01-12 MED ORDER — HYDROCODONE-ACETAMINOPHEN 10-325 MG PO TABS: ORAL_TABLET | ORAL | 0 refills | Status: AC

## 2024-01-12 MED ORDER — ALPRAZOLAM 1 MG PO TABS: ORAL_TABLET | ORAL | 2 refills | Status: AC

## 2024-01-16 LAB — TOXASSURE SELECT 13 (MW), URINE

## 2024-03-29 ENCOUNTER — Telehealth: Payer: Self-pay | Admitting: Pharmacy Technician

## 2024-03-29 ENCOUNTER — Other Ambulatory Visit (HOSPITAL_COMMUNITY): Payer: Self-pay

## 2024-03-29 NOTE — Telephone Encounter (Signed)
 Pharmacy Patient Advocate Encounter   Received notification from [facility] that prior authorization for ALPRAZolam  1MG  tablets is required/requested.   Insurance verification completed.   The patient is insured through [employer].   Per test claim: PA required; PA submitted to above mentioned insurance via Latent Key/confirmation #/EOC [id number] Status is pending

## 2024-03-29 NOTE — Telephone Encounter (Signed)
 Pharmacy Patient Advocate Encounter  Received notification from [facility] that Prior Authorization for ALPRAZolam  1MG  tablets has been APPROVED from 02/28/2024 to 03/29/2025. Unable to obtain price due to refill too soon rejection, last fill date 03/29/2024 next available fill date03/03/2024.   PA #/Case ID/Reference #: [id number]

## 2024-04-09 ENCOUNTER — Ambulatory Visit: Admitting: Nurse Practitioner
